# Patient Record
Sex: Female | Born: 1950 | Race: White | Hispanic: No | Marital: Married | Smoking: Former smoker
Health system: Southern US, Community
[De-identification: ages and names within clinical notes are randomized; demographics above are authoritative.]

## PROBLEM LIST (undated history)

## (undated) DIAGNOSIS — K219 Gastro-esophageal reflux disease without esophagitis: Secondary | ICD-10-CM

## (undated) DIAGNOSIS — M19012 Primary osteoarthritis, left shoulder: Secondary | ICD-10-CM

## (undated) DIAGNOSIS — E7800 Pure hypercholesterolemia, unspecified: Secondary | ICD-10-CM

## (undated) DIAGNOSIS — F329 Major depressive disorder, single episode, unspecified: Secondary | ICD-10-CM

## (undated) DIAGNOSIS — F32A Depression, unspecified: Secondary | ICD-10-CM

## (undated) DIAGNOSIS — D219 Benign neoplasm of connective and other soft tissue, unspecified: Secondary | ICD-10-CM

## (undated) DIAGNOSIS — M1711 Unilateral primary osteoarthritis, right knee: Secondary | ICD-10-CM

## (undated) DIAGNOSIS — I10 Essential (primary) hypertension: Secondary | ICD-10-CM

## (undated) HISTORY — PX: TONSILLECTOMY: SUR1361

## (undated) HISTORY — PX: TUBAL LIGATION: SHX77

## (undated) HISTORY — DX: Pure hypercholesterolemia, unspecified: E78.00

## (undated) HISTORY — PX: BREAST EXCISIONAL BIOPSY: SUR124

## (undated) HISTORY — PX: REDUCTION MAMMAPLASTY: SUR839

## (undated) HISTORY — DX: Depression, unspecified: F32.A

## (undated) HISTORY — DX: Major depressive disorder, single episode, unspecified: F32.9

## (undated) HISTORY — PX: BREAST SURGERY: SHX581

## (undated) HISTORY — DX: Essential (primary) hypertension: I10

## (undated) HISTORY — DX: Gastro-esophageal reflux disease without esophagitis: K21.9

## (undated) HISTORY — PX: EYE SURGERY: SHX253

## (undated) HISTORY — DX: Benign neoplasm of connective and other soft tissue, unspecified: D21.9

## (undated) HISTORY — PX: ABDOMINAL HYSTERECTOMY: SHX81

## (undated) HISTORY — PX: OTHER SURGICAL HISTORY: SHX169

---

## 1999-08-10 ENCOUNTER — Other Ambulatory Visit: Admission: RE | Admit: 1999-08-10 | Discharge: 1999-08-10 | Payer: Self-pay | Admitting: Obstetrics and Gynecology

## 2000-04-27 ENCOUNTER — Encounter: Payer: Self-pay | Admitting: Family Medicine

## 2000-04-27 ENCOUNTER — Encounter: Admission: RE | Admit: 2000-04-27 | Discharge: 2000-04-27 | Payer: Self-pay | Admitting: Family Medicine

## 2000-08-14 ENCOUNTER — Other Ambulatory Visit: Admission: RE | Admit: 2000-08-14 | Discharge: 2000-08-14 | Payer: Self-pay | Admitting: Obstetrics and Gynecology

## 2000-09-25 ENCOUNTER — Ambulatory Visit (HOSPITAL_COMMUNITY): Admission: RE | Admit: 2000-09-25 | Discharge: 2000-09-25 | Payer: Self-pay | Admitting: Gastroenterology

## 2000-09-25 ENCOUNTER — Encounter (INDEPENDENT_AMBULATORY_CARE_PROVIDER_SITE_OTHER): Payer: Self-pay | Admitting: *Deleted

## 2001-02-06 ENCOUNTER — Emergency Department (HOSPITAL_COMMUNITY): Admission: EM | Admit: 2001-02-06 | Discharge: 2001-02-07 | Payer: Self-pay | Admitting: Emergency Medicine

## 2001-02-07 ENCOUNTER — Encounter: Payer: Self-pay | Admitting: Emergency Medicine

## 2001-06-17 ENCOUNTER — Encounter: Payer: Self-pay | Admitting: Obstetrics and Gynecology

## 2001-06-17 ENCOUNTER — Encounter: Admission: RE | Admit: 2001-06-17 | Discharge: 2001-06-17 | Payer: Self-pay | Admitting: Obstetrics and Gynecology

## 2001-06-20 ENCOUNTER — Encounter: Payer: Self-pay | Admitting: Family Medicine

## 2001-06-20 ENCOUNTER — Encounter: Admission: RE | Admit: 2001-06-20 | Discharge: 2001-06-20 | Payer: Self-pay | Admitting: Family Medicine

## 2001-10-16 ENCOUNTER — Other Ambulatory Visit: Admission: RE | Admit: 2001-10-16 | Discharge: 2001-10-16 | Payer: Self-pay | Admitting: Obstetrics and Gynecology

## 2002-07-17 ENCOUNTER — Encounter: Payer: Self-pay | Admitting: Family Medicine

## 2002-07-17 ENCOUNTER — Encounter: Admission: RE | Admit: 2002-07-17 | Discharge: 2002-07-17 | Payer: Self-pay | Admitting: Family Medicine

## 2002-12-08 ENCOUNTER — Ambulatory Visit (HOSPITAL_COMMUNITY): Admission: RE | Admit: 2002-12-08 | Discharge: 2002-12-08 | Payer: Self-pay | Admitting: Gastroenterology

## 2002-12-08 ENCOUNTER — Encounter (INDEPENDENT_AMBULATORY_CARE_PROVIDER_SITE_OTHER): Payer: Self-pay | Admitting: *Deleted

## 2002-12-11 ENCOUNTER — Encounter: Payer: Self-pay | Admitting: Gastroenterology

## 2002-12-11 ENCOUNTER — Encounter: Admission: RE | Admit: 2002-12-11 | Discharge: 2002-12-11 | Payer: Self-pay | Admitting: Gastroenterology

## 2003-08-17 ENCOUNTER — Other Ambulatory Visit: Admission: RE | Admit: 2003-08-17 | Discharge: 2003-08-17 | Payer: Self-pay | Admitting: Obstetrics and Gynecology

## 2003-09-10 ENCOUNTER — Encounter: Admission: RE | Admit: 2003-09-10 | Discharge: 2003-09-10 | Payer: Self-pay | Admitting: Obstetrics and Gynecology

## 2004-08-26 ENCOUNTER — Other Ambulatory Visit: Admission: RE | Admit: 2004-08-26 | Discharge: 2004-08-26 | Payer: Self-pay | Admitting: Obstetrics and Gynecology

## 2004-10-17 ENCOUNTER — Encounter: Admission: RE | Admit: 2004-10-17 | Discharge: 2005-01-15 | Payer: Self-pay | Admitting: Internal Medicine

## 2005-05-08 ENCOUNTER — Encounter: Admission: RE | Admit: 2005-05-08 | Discharge: 2005-05-08 | Payer: Self-pay | Admitting: Obstetrics and Gynecology

## 2005-09-12 ENCOUNTER — Other Ambulatory Visit: Admission: RE | Admit: 2005-09-12 | Discharge: 2005-09-12 | Payer: Self-pay | Admitting: Obstetrics and Gynecology

## 2005-10-24 ENCOUNTER — Encounter: Admission: RE | Admit: 2005-10-24 | Discharge: 2005-10-24 | Payer: Self-pay | Admitting: Family Medicine

## 2006-06-15 ENCOUNTER — Encounter: Admission: RE | Admit: 2006-06-15 | Discharge: 2006-06-15 | Payer: Self-pay | Admitting: Obstetrics and Gynecology

## 2006-07-27 ENCOUNTER — Encounter: Admission: RE | Admit: 2006-07-27 | Discharge: 2006-07-27 | Payer: Self-pay | Admitting: Gastroenterology

## 2006-09-13 ENCOUNTER — Other Ambulatory Visit: Admission: RE | Admit: 2006-09-13 | Discharge: 2006-09-13 | Payer: Self-pay | Admitting: Obstetrics and Gynecology

## 2006-12-24 ENCOUNTER — Inpatient Hospital Stay (HOSPITAL_COMMUNITY): Admission: EM | Admit: 2006-12-24 | Discharge: 2006-12-28 | Payer: Self-pay | Admitting: Emergency Medicine

## 2006-12-24 ENCOUNTER — Encounter: Admission: RE | Admit: 2006-12-24 | Discharge: 2006-12-24 | Payer: Self-pay | Admitting: Family Medicine

## 2007-06-17 ENCOUNTER — Encounter: Admission: RE | Admit: 2007-06-17 | Discharge: 2007-06-17 | Payer: Self-pay | Admitting: Family Medicine

## 2007-09-23 ENCOUNTER — Other Ambulatory Visit: Admission: RE | Admit: 2007-09-23 | Discharge: 2007-09-23 | Payer: Self-pay | Admitting: Obstetrics and Gynecology

## 2008-06-17 ENCOUNTER — Encounter: Admission: RE | Admit: 2008-06-17 | Discharge: 2008-06-17 | Payer: Self-pay | Admitting: Obstetrics and Gynecology

## 2008-09-25 ENCOUNTER — Encounter: Payer: Self-pay | Admitting: Obstetrics and Gynecology

## 2008-09-25 ENCOUNTER — Ambulatory Visit: Payer: Self-pay | Admitting: Obstetrics and Gynecology

## 2008-09-25 ENCOUNTER — Other Ambulatory Visit: Admission: RE | Admit: 2008-09-25 | Discharge: 2008-09-25 | Payer: Self-pay | Admitting: Obstetrics and Gynecology

## 2009-06-18 ENCOUNTER — Encounter: Admission: RE | Admit: 2009-06-18 | Discharge: 2009-06-18 | Payer: Self-pay | Admitting: Obstetrics and Gynecology

## 2009-10-05 ENCOUNTER — Other Ambulatory Visit: Admission: RE | Admit: 2009-10-05 | Discharge: 2009-10-05 | Payer: Self-pay | Admitting: Obstetrics and Gynecology

## 2009-10-05 ENCOUNTER — Ambulatory Visit: Payer: Self-pay | Admitting: Obstetrics and Gynecology

## 2010-03-18 ENCOUNTER — Ambulatory Visit (HOSPITAL_BASED_OUTPATIENT_CLINIC_OR_DEPARTMENT_OTHER): Admission: RE | Admit: 2010-03-18 | Discharge: 2010-03-18 | Payer: Self-pay | Admitting: Orthopedic Surgery

## 2010-03-18 HISTORY — PX: CARPAL TUNNEL RELEASE: SHX101

## 2010-06-24 ENCOUNTER — Encounter: Admission: RE | Admit: 2010-06-24 | Discharge: 2010-06-24 | Payer: Self-pay | Admitting: Obstetrics and Gynecology

## 2010-08-26 ENCOUNTER — Encounter
Admission: RE | Admit: 2010-08-26 | Discharge: 2010-08-26 | Payer: Self-pay | Source: Home / Self Care | Attending: Family Medicine | Admitting: Family Medicine

## 2010-09-18 HISTORY — PX: BACK SURGERY: SHX140

## 2010-10-06 ENCOUNTER — Ambulatory Visit: Admit: 2010-10-06 | Payer: Self-pay | Admitting: Obstetrics and Gynecology

## 2010-10-09 ENCOUNTER — Encounter: Payer: Self-pay | Admitting: Obstetrics and Gynecology

## 2010-12-04 LAB — BASIC METABOLIC PANEL
BUN: 18 mg/dL (ref 6–23)
Calcium: 9.5 mg/dL (ref 8.4–10.5)
Creatinine, Ser: 0.68 mg/dL (ref 0.4–1.2)
GFR calc non Af Amer: >60 mL/min (ref >60)
Glucose, Bld: 108 mg/dL — ABNORMAL HIGH (ref 70–99)
Potassium: 5 mEq/L (ref 3.5–5.1)

## 2010-12-29 ENCOUNTER — Other Ambulatory Visit (HOSPITAL_COMMUNITY)
Admission: RE | Admit: 2010-12-29 | Discharge: 2010-12-29 | Disposition: A | Discharge status: Home / Self Care | Payer: BLUE CROSS/BLUE SHIELD | Source: Ambulatory Visit | Attending: Obstetrics and Gynecology | Admitting: Obstetrics and Gynecology

## 2010-12-29 ENCOUNTER — Other Ambulatory Visit: Payer: Self-pay | Admitting: Obstetrics and Gynecology

## 2010-12-29 ENCOUNTER — Encounter (INDEPENDENT_AMBULATORY_CARE_PROVIDER_SITE_OTHER): Payer: BLUE CROSS/BLUE SHIELD | Admitting: Obstetrics and Gynecology

## 2010-12-29 DIAGNOSIS — Z124 Encounter for screening for malignant neoplasm of cervix: Secondary | ICD-10-CM | POA: Insufficient documentation

## 2010-12-29 DIAGNOSIS — Z01419 Encounter for gynecological examination (general) (routine) without abnormal findings: Secondary | ICD-10-CM

## 2011-02-03 NOTE — Procedures (Signed)
[place]  Patient:    Madeline Clarke, Madeline Clarke                           MRN: [id number] Proc. Date: 09/25/00 Attending:  Petra Kuba, M.D. CC:         Doreatha Lew, M.D.   Procedure Report  PROCEDURE:  Colonoscopy.  INDICATION:  Patient with bright red blood per rectum, almost due for colonic screening.  Consent signed after risks, benefits, methods, and options thoroughly discussed in the office.  MEDICINES USED:  Demerol 75 mg, Versed 8 mg.  DESCRIPTION OF PROCEDURE:  Rectal inspection was pertinent for tiny external hemorrhoids.  Digital exam was negative.  Pediatric video colonoscope was inserted and with mild difficulty due to a tortuous sigmoid was able to be advanced to the cecum.  This required some abdominal pressure but no position changes.  Cecum was identified by the appendiceal orifice and the ileocecal valve.  In fact, the scope was inserted a short way into the terminal ileum, which was normal.  Photo documentation was obtained.  The scope was slowly withdrawn.  The prep was adequate.  There was some liquid stool that required washing and suctioning.  No obvious abnormalities were seen on insertion and on slow withdrawal through the colon, no abnormalities were seen as we slowly withdrew back to the rectum.  Once back in the rectum the scope was retroflexed, revealing some tiny internal hemorrhoids, possibly a small healed tear was seen.  The scope was straightened and re-advanced a short way up the sigmoid, air was suctioned, and the scope removed.  The patient tolerated the procedure well.  There was no obvious immediate complication.  ENDOSCOPIC DIAGNOSES: 1. Tiny internal-external hemorrhoid, possibly healed internal tear. 2. Otherwise within normal limits to the terminal ileum without any blood    being seen.  PLAN:  Continue workup with an endoscopy for her chronic upper tract symptoms. Recheck guaiacs and follow up in two  months.  Happy to see back sooner p.r.n. Otherwise, probably will repeat screening in five years and yearly rectals and guaiacs per Dr. Adonis Housekeeper or GYN.  See endoscopy further workup plans and findings. DD:  09/25/00 TD:  09/25/00 Job: [id number]

## 2011-02-03 NOTE — Op Note (Signed)
[place]  Patient:    Madeline Clarke, Madeline Clarke                           MRN: [id number] Proc. Date: 09/25/00 Attending:  Petra Kuba, M.D. CC:         Doreatha Lew, M.D.   Operative Report  PROCEDURE:  Esophagogastroduodenoscopy with biopsy.  ENDOSCOPIST:  Petra Kuba, M.D.  ANESTHESIA:  General anesthesia per patient request.  INDICATIONS: Longstanding upper tract symptoms, helped with Prilosec 40.  Want to rule out Barretts, confirm hiatal hernia.  Consent was signed after risks, benefits, methods, and options were thoroughly discussed before any premedications were given and in the office.  MEDICINES USED:  Demerol 25 mg, Versed 2 mg.  DESCRIPTION OF PROCEDURE:  Video endoscope was inserted by direct vision.  Her proximal and mid esophagus was normal.  In the distal esophagus was a small to medium size hiatal hernia.  I did not believe she had Barretts, but the hernia did extend up the esophagus, and multiple distal esophageal biopsies to rule out any metaplasia were obtained at the end of the procedure.  The scope was passed into the stomach where some mild gastritis and antritis were seen. Advanced into a normal pylorus and normal duodenal bulb and around the cecum to a normal second portion of the duodenum.  Scope was withdraw to the bulb, and a good look there ruled out ulcers or other abnormalities.  Scope was withdrawn back to the stomach and retroflexed.  High in the cardia, the hiatal hernia was confirmed.  The anularis, fundus, lesser and greater curve were normal other than some mild gastritis on retroflexion.  Straight visualization confirmed the gastritis and ruled out any additional findings.  The scope was slowly withdrawn back to 20 cm which confirmed the above findings.  The scope was then advanced to the distal esophagus, and biopsies of the questionable tuft of the hiatal hernia pouch versus an area of short segment  Barretts were obtained.  The scope was then slowly withdrawn.  The patient tolerated the procedure well.  There was no obvious immediate complication.  ENDOSCOPIC DIAGNOSES: 1. Small to medium hiatal hernia. 2. Doubt Barretts versus biopsies of the top of the hiatal hernia pouch. 3. Minimal gastritis and antritis. 4. Otherwise normal esophagogastroduodenoscopy.  PLAN:  Continue Prilosec.  Await biopsy to see if further Barretts screening is needed.  Will see back in two months or p.r.n. D:  09/25/00 TD:  09/25/00 Job: [id number]

## 2011-02-03 NOTE — Op Note (Signed)
NAMEBENNIE, Madeline Clarke                            ACCOUNT NO.:  [id number]   MEDICAL RECORD NO.:  [id number]                   PATIENT TYPE:  AMB   LOCATION:  ENDO                                 FACILITY:   PHYSICIAN:  Petra Kuba, M.D.                 DATE OF BIRTH:  03/24/1951   DATE OF PROCEDURE:  12/08/2002  DATE OF DISCHARGE:                                 OPERATIVE REPORT   PROCEDURE:  Esophagogastroduodenoscopy with biopsy.   INDICATIONS:  The patient with increasing upper tract symptoms, questionable  history of Barrett's.  Consent was signed after risks, benefits, methods,  options were thoroughly discussed in the past.   MEDICATIONS:  Demerol 100, Versed 10.   DESCRIPTION OF PROCEDURE:  The video endoscope was inserted by direct  vision.  The proximal and mid esophagus were normal.  In the distal  esophagus was a tiny to small hiatal hernia.  I do not think she had  Barrett's but the hiatal hernia top seemed to encroach on the esophagus and  multiple biopsies of this area were taken at the end of the procedure.  The  scope was inserted into the stomach, advanced through a normal antrum,  normal pylorus into a normal duodenal bulb and on to a normal second portion  of the duodenum.  The scope was slowly withdrawn back to the bulb.  No  duodenal findings were seen.  Scope was withdrawn back to the stomach and  evaluated on retroflexion and straight visualization.  A good look at the  cardia, fundus, annularis, lesser and greater curve.  No stomach  abnormalities were seen.  The scope was then slowly withdrawn back to about  18 cm which confirmed the normal esophagus.  The biopsies of the distal  esophagus were obtained.  Air was suctioned from the stomach.  The scope was  slowly withdrawn and again a good look at the esophagus was normal.  The  scope was removed.  The patient tolerated the procedure well.  There was no  obvious immediate complications.   ENDOSCOPIC  DIAGNOSES:  1. Small hiatal hernia, doubt Barrett's, status post biopsy.  2. Otherwise normal esophagogastroduodenoscopy.   PLAN:  Proton pump inhibitors.  Consider Reglan in the future.  Will get an  ultrasound of her abdomen to make sure gallstones are not playing a role in  her symptoms.  Follow up in roughly two months to recheck symptoms and make  sure no further workup plans are needed or trying other pump inhibitors.                                                Petra Kuba, M.D.    MEM/MEDQ  D:  12/08/2002  T:  12/08/2002  Job:  [id number]   cc:   Jethro Bastos, M.D.  [address]  [place]  [place] [address]  Fax: [phone]

## 2011-02-03 NOTE — Discharge Summary (Signed)
Madeline Clarke, Madeline Clarke                  ACCOUNT NO.:  [id number]   MEDICAL RECORD NO.:  [id number]          PATIENT TYPE:  INP   LOCATION:  1401                         FACILITY:  [facility]   PHYSICIAN:  Madeline Clarke, M.D.DATE OF BIRTH:  10/29/1950   DATE OF ADMISSION:  12/24/2006  DATE OF DISCHARGE:  12/28/2006                               DISCHARGE SUMMARY   DISCHARGE DIAGNOSES:  1. Lingular/left lower lobe pneumonia.  2. Probable chronic obstructive pulmonary disease exacerbation;      patient with past history of tobacco abuse and with bronchospasm      upon admission, likely precipitated by #1.  3. Hypertension.  4. Hyperlipidemia.  5. History of depression.  6. Hypokalemia; resolved.   HISTORY:  The patient is a pleasant 60 year old white female with above-  listed medical problems who presented with complaints of shortness of  breath.  She reported that she had had problems with intermittent cough  and dyspnea on exertion.  In the 24 hours prior to admission she stated  that her cough and shortness of breath had worsened significantly.  The  patient had seen her primary care physician upon followup and was found  to be hypoxic, O2 sat in the 70s, and improved slightly following the  breathing treatment, but her PCP was concerned about a pneumonia, and  she was sent to the emergency room where she was admitted to the [facility] service.   Physical exam upon admission as per Dr. Rito Clarke revealed a temperature  of 99.7 with a pulse of 94, blood pressure of 133/88, respiratory rate  of 20, O2 sat of 97%.  In general, the patient was alert and oriented,  in some mild respiratory distress but improved on nasal cannula oxygen.  The other pertinent findings on exam:  HEENT with dry mucous membranes,  and on her lung exam she was noted to have bilateral expiratory wheezes,  right greater than left, and decreased breath sounds at the bases.  The  rest of her physical exam  was reported to be within normal limits.   LABORATORY DATA:  Her white cell count was 10.7 with a hemoglobin of  12.3, hematocrit of 34.8, platelet count of 273.  Sodium 135 with a  potassium of 3.4, chloride 95, CO2 of 30, glucose of 123, BUN of 6,  creatinine of 0.6.  She had a chest x-ray done, which showed lingular  pneumonia and followup for clearing recommended.   HOSPITAL COURSE:  Problem #1:  Lingular pneumonia.  Upon admission the  patient was empirically started on IV antibiotics, and blood cultures  were done and this did not grow any bacteria.  She had a followup chest  x-ray while in the hospital, which showed the opacity in the left lower  lobe persisting, consistent with a residual pneumonia.  The patient's  shortness of breath improved as well as the cough.  Her energy level  also improved, and patient has been able to ambulate and tolerate p.o.  well.  She was placed on expectorant/mucolytics while in the hospital,  and the cough  has also improved significantly.  Madeline Clarke has remained  afebrile and hemodynamically stable and, as already discussed, her  symptoms have improved.  She will be discharged at this point on oral  antibiotics and is to follow up with her primary care physician, Dr.  Dorothe Clarke.   Problem #2:  Probable chronic obstructive pulmonary disease  exacerbation.  Patient was noted to have bilateral wheezing upon  admission and was started on steroids as well as nebulized  bronchodilators.  As discussed above, she was also placed on antibiotics  and mucolytics.  With this intervention, her symptoms improved.  Her O2  sats were checked on room air today and they ranged from 86-88%.  She  meets criteria for home O2 and will be discharged on home O2.  She is to  follow up with her primary care physician for this to be reassessed.  She is to continue Combivent and prednisone taper upon discharge as well  as the antibiotics as above.   Problem #3:   Hypokalemia.  The patient's potassium was replaced while in  the hospital.  Her last potassium today prior to discharge is 3.8.   Problem #4:  Hypertension.  Patient was maintained on her outpatient  antihypertensives during her hospital stay.   Problem #5:  Gastroesophageal reflux disease.  Patient was maintained on  proton pump inhibitor during her hospital stay.   Problem #6:  Depression.  Patient was maintained on her Effexor during  her hospital stay.   DISCHARGE MEDICATIONS:  1. Mucinex 600 mg two p.o. b.i.d.  2. Tussionex 5 mL p.o. q.h.s. p.r.n.  3. Avelox 400 mg p.o. daily for 10 more days.  4. Combivent two puffs q.i.d.  5. Prednisone taper as directed.  6. Patient to continue Aciphex, atenolol, Crestor, Diovan, Effexor,      estradiol, hydrochlorothiazide, and verapamil.   FOLLOWUP:  Dr. Dorothe Clarke next week.  Patient to call for appointment.   DISCHARGE CONDITION:  Improved/stable.      Madeline Clarke, M.D.  Electronically Signed     ACV/MEDQ  D:  12/28/2006  T:  12/28/2006  Job:  [id number]   cc:   Madeline Clarke, M.D.  Fax: [phone]

## 2011-02-03 NOTE — H&P (Signed)
Madeline Clarke, Madeline Clarke                  ACCOUNT NO.:  [id number]   MEDICAL RECORD NO.:  [id number]          PATIENT TYPE:  INP   LOCATION:  0102                         FACILITY:  [facility]   PHYSICIAN:  Hollice Espy, M.D.DATE OF BIRTH:  October 31, 1950   DATE OF ADMISSION:  12/24/2006  DATE OF DISCHARGE:                              HISTORY & PHYSICAL   PRIMARY CARE PHYSICIAN:  Jethro Bastos, M.D.   CHIEF COMPLAINT:  Shortness of breath.   HISTORY OF PRESENT ILLNESS:  The patient is a 60 year old, white female  with past medical history of GERD, hypertension and hyperlipidemia who  for the past 5 days has had problems with intermittent cough, dyspnea on  exertion.  Over the last 24 hours, her cough and shortness of breath has  markedly  progressed.  She initially had followed up with her PCP on the  second day of her symptoms, but now has progressed to the point when Dr.  Dorothe Pea saw the patient in followup, found her to be quite hypoxic with  an O2 saturation in the 70s.  After a breathing treatment, her  oxygenation improved little.  There was concern about the patient having  pneumonia and the patient was sent over the emergency room where she was  met by the hospitalist for further evaluation.  The patient was put on  oxygen and nebulizers and has said that has improved her symptoms quite  a bit.  She complains of a generalized headache, but denies any visual  changes, dysphagia, no neck stiffness, no chest pain, no palpitations.  She does complain of some shortness of breath better with oxygenation.  She does complain of wheezing and coughing with a productive cough with  sputum ranging anywhere from clear to yellowish, to brown and green.  She denies any hemoptysis, no abdominal pain, no hematuria, dysuria,  constipation, diarrhea, focal extremity numbness, weakness or pain.  Review of systems otherwise negative.   PAST MEDICAL HISTORY:  1. Hypertension.  2. Hyperlipidemia.  3. Tobacco use.  Quit 10 years ago.  4. GERD.  5. Depression.   MEDICATIONS:  Aciphex, albuterol, atenolol, Crestor, Diovan, Effexor,  Estradiol, hydrochlorothiazide and Verapamil.   ALLERGIES:  PENICILLIN.   SOCIAL HISTORY:  She denies any current tobacco use.  She smoked x11  years and then quit 9 years ago.  She denies any drug use.  She drinks  socially.   FAMILY HISTORY:  Noncontributory.   PHYSICAL EXAMINATION:  VITAL SIGNS:  Temperature 99.7, heart rate 94,  blood pressure 133/88, respirations 20, O2 saturations 97% on 2 L.  GENERAL:  The patient is alert and oriented x3 in some mild respiratory  distress, but better with oxygen.  HEENT:  Normocephalic, atraumatic.  Mucous membranes are dry.  She has  no carotid bruits.  HEART:  Regular rate and rhythm, S1, S2.  LUNGS:  Bilateral expiratory wheezing with right greater than left and  decreased breath sounds at bases.  ABDOMEN:  Soft, nondistended, nontender, positive bowel sounds.  EXTREMITIES:  No clubbing, cyanosis or edema.   LABORATORY DATA AND X-RAY  FINDINGS:  I have ordered a CBC and BMET which  are pending.   ASSESSMENT/PLAN:  1. Pneumonia with questionable underlying chronic obstructive      pulmonary disease.  Will plan to start the patient on intravenous      Avelox, supplement oxygen and around the clock breathing      treatments.  In addition, because of her significant wheezing, I am      adding Solu-Medrol at this time.  2. Hypertension.  Will get her medication doses and start this as      well.  3. Hyperlipidemia.  Continue Crestor.  4. Depression.  Continue Effexor.      Hollice Espy, M.D.  Electronically Signed     SKK/MEDQ  D:  12/24/2006  T:  12/24/2006  Job:  [id number]   cc:   Jethro Bastos, M.D.  Fax: [phone]

## 2011-06-01 ENCOUNTER — Other Ambulatory Visit: Payer: Self-pay | Admitting: Obstetrics and Gynecology

## 2011-06-01 DIAGNOSIS — Z1231 Encounter for screening mammogram for malignant neoplasm of breast: Secondary | ICD-10-CM

## 2011-07-03 ENCOUNTER — Ambulatory Visit
Admission: RE | Admit: 2011-07-03 | Discharge: 2011-07-03 | Disposition: A | Discharge status: Home / Self Care | Payer: BC Managed Care – PPO | Source: Ambulatory Visit | Attending: Obstetrics and Gynecology | Admitting: Obstetrics and Gynecology

## 2011-07-03 DIAGNOSIS — Z1231 Encounter for screening mammogram for malignant neoplasm of breast: Secondary | ICD-10-CM

## 2011-12-29 ENCOUNTER — Encounter: Payer: Self-pay | Admitting: Gynecology

## 2011-12-29 DIAGNOSIS — I10 Essential (primary) hypertension: Secondary | ICD-10-CM | POA: Insufficient documentation

## 2011-12-29 DIAGNOSIS — K219 Gastro-esophageal reflux disease without esophagitis: Secondary | ICD-10-CM | POA: Insufficient documentation

## 2011-12-29 DIAGNOSIS — F32A Depression, unspecified: Secondary | ICD-10-CM | POA: Insufficient documentation

## 2011-12-29 DIAGNOSIS — D219 Benign neoplasm of connective and other soft tissue, unspecified: Secondary | ICD-10-CM | POA: Insufficient documentation

## 2011-12-29 DIAGNOSIS — F329 Major depressive disorder, single episode, unspecified: Secondary | ICD-10-CM | POA: Insufficient documentation

## 2012-01-09 ENCOUNTER — Ambulatory Visit (INDEPENDENT_AMBULATORY_CARE_PROVIDER_SITE_OTHER): Payer: BC Managed Care – PPO | Admitting: Obstetrics and Gynecology

## 2012-01-09 ENCOUNTER — Encounter: Payer: Self-pay | Admitting: Obstetrics and Gynecology

## 2012-01-09 VITALS — BP 144/86 | Ht 61.0 in | Wt 172.0 lb

## 2012-01-09 DIAGNOSIS — E7800 Pure hypercholesterolemia, unspecified: Secondary | ICD-10-CM | POA: Insufficient documentation

## 2012-01-09 DIAGNOSIS — Z01419 Encounter for gynecological examination (general) (routine) without abnormal findings: Secondary | ICD-10-CM

## 2012-01-09 NOTE — Progress Notes (Signed)
Patient came to see me today for her annual GYN exam. She is doing well without HRT. She is up-to-date on mammograms. Her last bone density was 2007 and was normal. She has had no fractures. She is having no vaginal bleeding. She is having no pelvic pain. She's not sexually active. She is not having dysuria, frequency, urgency or incontinence.  HEENT: Within normal limits. Kennon Portela present. Neck: No masses. Supraclavicular lymph nodes: Not enlarged. Breasts: Examined in both sitting and lying position. Symmetrical without skin changes or masses. Abdomen: Soft no masses guarding or rebound. No hernias. Pelvic: External within normal limits. BUS within normal limits. Vaginal examination shows good estrogen effect, no cystocele enterocele or rectocele. Cervix and uterus absent. Adnexa within normal limits. Rectovaginal confirmatory. Extremities within normal limits.  Assessment: Normal GYN exam  Plan: Continue yearly mammograms. Bone density. Lab work through PCP.

## 2012-06-03 ENCOUNTER — Telehealth: Payer: Self-pay | Admitting: *Deleted

## 2012-06-03 ENCOUNTER — Other Ambulatory Visit: Payer: Self-pay | Admitting: Obstetrics and Gynecology

## 2012-06-03 DIAGNOSIS — Z1231 Encounter for screening mammogram for malignant neoplasm of breast: Secondary | ICD-10-CM

## 2012-06-03 DIAGNOSIS — Z8739 Personal history of other diseases of the musculoskeletal system and connective tissue: Secondary | ICD-10-CM

## 2012-06-03 DIAGNOSIS — Z9889 Other specified postprocedural states: Secondary | ICD-10-CM

## 2012-06-03 NOTE — Telephone Encounter (Signed)
Pt called requesting order for bone density placed at breast center, order placed.

## 2012-06-11 ENCOUNTER — Encounter (HOSPITAL_BASED_OUTPATIENT_CLINIC_OR_DEPARTMENT_OTHER): Payer: Self-pay | Admitting: *Deleted

## 2012-06-11 ENCOUNTER — Other Ambulatory Visit: Payer: Self-pay | Admitting: Orthopedic Surgery

## 2012-06-11 NOTE — Progress Notes (Signed)
To come in fro bmet-ekg-was here 2011 for rt ctr

## 2012-06-12 ENCOUNTER — Encounter (HOSPITAL_BASED_OUTPATIENT_CLINIC_OR_DEPARTMENT_OTHER)
Admission: RE | Admit: 2012-06-12 | Discharge: 2012-06-12 | Disposition: A | Discharge status: Home / Self Care | Payer: BC Managed Care – PPO | Source: Ambulatory Visit | Attending: Orthopedic Surgery | Admitting: Orthopedic Surgery

## 2012-06-12 LAB — BASIC METABOLIC PANEL
BUN: 22 mg/dL (ref 6–23)
Chloride: 100 mEq/L (ref 96–112)
Creatinine, Ser: 0.64 mg/dL (ref 0.50–1.10)
Glucose, Bld: 115 mg/dL — ABNORMAL HIGH (ref 70–99)
Potassium: 4.7 mEq/L (ref 3.5–5.1)

## 2012-06-13 NOTE — H&P (Signed)
Madeline Clarke is an 61 y.o. female.   Chief Complaint: c/o chronic and progressive numbness and tingling of the left hand HPI: Madeline Clarke returned to discuss a number of issues affecting her hands. 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 She has had several injections into the left ulnar bursa to provide temporary relief of her symptoms and she now wishes to proceed with surgical intervention to the left hand.  Madeline Clarke also has bilateral thumb CMC arthrosis with shouldering of her thumbs and a palpable loose body on the left. 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 She also has swelling of her left index finger PIP joint due to osteoarthrosis. She has both daytime and nighttime numbness. She has been wearing wrist forearm splints with only partial relief. She does have a strong family history of osteoarthritis affecting her mother. Her mother may also have experienced rheumatoid arthritis.  Past Medical History  Diagnosis Date  . Fibroid   . Hypertension   . GERD (gastroesophageal reflux disease)   . Depression   . Elevated cholesterol     Past Surgical History  Procedure Date  . Carpal tunnel release 7/11    rt ctr  . Tubal ligation   . Abdominal hysterectomy     TAH  . Microsurgical tubal reanastomosis   . Left salpingectomy   . Tonsillectomy   . Back surgery 2012  . Eye surgery     [id number] 08-31-14    Family History  Problem Relation Age of Onset  . Hypertension Mother   . Hypertension Sister   . Hypertension Brother    Social History:  reports that she quit smoking about 15 years ago. She does not have any smokeless tobacco  history on file. She reports that she drinks about 10 ounces of alcohol per week. She reports that she does not use illicit drugs.  Allergies:  Allergies  Allergen Reactions  . Bee Venom Swelling    Throat swelling  . Amlodipine Swelling  . Penicillins   . Shellfish Allergy Hives    No prescriptions prior to admission    Results for orders placed during the hospital encounter of 06/14/12 (from the past 48 hour(s))  BASIC METABOLIC PANEL     Status: Abnormal   Collection Time   06/12/12 12:20 PM      Component Value Range Comment   Sodium 139  135 - 145 mEq/L    Potassium 4.7  3.5 - 5.1 mEq/L    Chloride 100  96 - 112 mEq/L    CO2 25  19 - 32 mEq/L    Glucose, Bld 115 (*) 70 - 99 mg/dL    BUN 22  6 - 23 mg/dL    Creatinine, Ser 1.61  0.50 - 1.10 mg/dL    Calcium 9.9  8.4 - 09.6 mg/dL    GFR calc non Af Amer >90  >90 mL/min    GFR calc Af Amer >90  >90 mL/min     No results found.   Pertinent items are noted in HPI.  Height 5\' 1"  (1.549 m), weight 78.019 kg (172 lb).  General appearance: alert Head: Normocephalic, without obvious abnormality Neck: supple, symmetrical, trachea  midline Resp: clear to auscultation bilaterally Cardio: regular rate and rhythm GI: normal findings: bowel sounds normal Extremities She has full AROM of her fingers in flexion/extension. There is no sign of stenosing tenosynovitis. She has discomfort with translation and grind of her CMC joints. She has hyperextension of her right thumb MP joint compensating for stiffness of her right thumb CMC joint. She has thickening of the capsule of her left index finger PIP joint consistent with osteoarthrosis. She has a positve wrist flexion test and Tinel's. 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 Injection of thumb CMC joints with steroid.  DASNOIT,Loys Shugars J 06/13/2012, 9:30 PM  H&P documentation: 06/14/2012  -History and Physical Reviewed  -Patient has been re-examined  -No change in the plan of care  Wyn Forster, MD

## 2012-06-14 ENCOUNTER — Encounter (HOSPITAL_BASED_OUTPATIENT_CLINIC_OR_DEPARTMENT_OTHER): Payer: Self-pay | Admitting: Anesthesiology

## 2012-06-14 ENCOUNTER — Encounter (HOSPITAL_BASED_OUTPATIENT_CLINIC_OR_DEPARTMENT_OTHER): Payer: Self-pay | Admitting: *Deleted

## 2012-06-14 ENCOUNTER — Encounter (HOSPITAL_BASED_OUTPATIENT_CLINIC_OR_DEPARTMENT_OTHER)
Admission: RE | Disposition: A | Discharge status: Home / Self Care | Payer: Self-pay | Source: Ambulatory Visit | Attending: Orthopedic Surgery

## 2012-06-14 ENCOUNTER — Ambulatory Visit (HOSPITAL_BASED_OUTPATIENT_CLINIC_OR_DEPARTMENT_OTHER): Payer: BC Managed Care – PPO | Admitting: Anesthesiology

## 2012-06-14 ENCOUNTER — Encounter (HOSPITAL_BASED_OUTPATIENT_CLINIC_OR_DEPARTMENT_OTHER): Payer: Self-pay | Admitting: Certified Registered Nurse Anesthetist

## 2012-06-14 ENCOUNTER — Ambulatory Visit (HOSPITAL_BASED_OUTPATIENT_CLINIC_OR_DEPARTMENT_OTHER)
Admission: RE | Admit: 2012-06-14 | Discharge: 2012-06-14 | Disposition: A | Discharge status: Home / Self Care | Payer: BC Managed Care – PPO | Source: Ambulatory Visit | Attending: Orthopedic Surgery | Admitting: Orthopedic Surgery

## 2012-06-14 DIAGNOSIS — G5600 Carpal tunnel syndrome, unspecified upper limb: Secondary | ICD-10-CM | POA: Insufficient documentation

## 2012-06-14 DIAGNOSIS — Z01812 Encounter for preprocedural laboratory examination: Secondary | ICD-10-CM | POA: Insufficient documentation

## 2012-06-14 DIAGNOSIS — K219 Gastro-esophageal reflux disease without esophagitis: Secondary | ICD-10-CM | POA: Insufficient documentation

## 2012-06-14 DIAGNOSIS — M19049 Primary osteoarthritis, unspecified hand: Secondary | ICD-10-CM | POA: Insufficient documentation

## 2012-06-14 DIAGNOSIS — I10 Essential (primary) hypertension: Secondary | ICD-10-CM | POA: Insufficient documentation

## 2012-06-14 DIAGNOSIS — Z01810 Encounter for preprocedural cardiovascular examination: Secondary | ICD-10-CM | POA: Insufficient documentation

## 2012-06-14 HISTORY — PX: STERIOD INJECTION: SHX5046

## 2012-06-14 HISTORY — PX: CARPAL TUNNEL RELEASE: SHX101

## 2012-06-14 LAB — POCT HEMOGLOBIN-HEMACUE: Hemoglobin: 10.7 g/dL — ABNORMAL LOW (ref 12.0–15.0)

## 2012-06-14 SURGERY — CARPAL TUNNEL RELEASE
Anesthesia: General | Site: Wrist | Laterality: Left | Wound class: Clean

## 2012-06-14 MED ORDER — LACTATED RINGERS IV SOLN
INTRAVENOUS | Status: DC
  Administered 2012-06-14 (×2): via INTRAVENOUS

## 2012-06-14 MED ORDER — MIDAZOLAM HCL 5 MG/5ML IJ SOLN
INTRAMUSCULAR | Status: DC | PRN
  Administered 2012-06-14: 1 mg via INTRAVENOUS

## 2012-06-14 MED ORDER — CHLORHEXIDINE GLUCONATE 4 % EX LIQD: 60.0000 mL | Freq: Once | CUTANEOUS | Status: DC

## 2012-06-14 MED ORDER — PROPOFOL 10 MG/ML IV BOLUS
INTRAVENOUS | Status: DC | PRN
  Administered 2012-06-14: 200 mg via INTRAVENOUS

## 2012-06-14 MED ORDER — LIDOCAINE HCL (CARDIAC) 20 MG/ML IV SOLN
INTRAVENOUS | Status: DC | PRN
  Administered 2012-06-14: 60 mg via INTRAVENOUS

## 2012-06-14 MED ORDER — OXYCODONE HCL 5 MG/5ML PO SOLN: 5.0000 mg | Freq: Once | ORAL | Status: DC | PRN

## 2012-06-14 MED ORDER — HYDROMORPHONE HCL PF 1 MG/ML IJ SOLN: 0.2500 mg | INTRAMUSCULAR | Status: DC | PRN

## 2012-06-14 MED ORDER — OXYCODONE HCL 5 MG PO TABS: 5.0000 mg | ORAL_TABLET | Freq: Once | ORAL | Status: DC | PRN

## 2012-06-14 MED ORDER — DROPERIDOL 2.5 MG/ML IJ SOLN: 0.6250 mg | INTRAMUSCULAR | Status: DC | PRN

## 2012-06-14 MED ORDER — LIDOCAINE HCL 2 % IJ SOLN
INTRAMUSCULAR | Status: DC | PRN
  Administered 2012-06-14: 1 mL

## 2012-06-14 MED ORDER — DEXAMETHASONE SODIUM PHOSPHATE 10 MG/ML IJ SOLN
INTRAMUSCULAR | Status: DC | PRN
  Administered 2012-06-14: 10 mg via INTRAVENOUS

## 2012-06-14 MED ORDER — ONDANSETRON HCL 4 MG/2ML IJ SOLN
INTRAMUSCULAR | Status: DC | PRN
  Administered 2012-06-14: 4 mg via INTRAVENOUS

## 2012-06-14 MED ORDER — HYDROCODONE-ACETAMINOPHEN 5-325 MG PO TABS: ORAL_TABLET | ORAL | Status: DC

## 2012-06-14 MED ORDER — METHYLPREDNISOLONE ACETATE 40 MG/ML IJ SUSP
INTRAMUSCULAR | Status: DC | PRN
  Administered 2012-06-14: 20 mg via INTRA_ARTICULAR

## 2012-06-14 MED ORDER — FENTANYL CITRATE 0.05 MG/ML IJ SOLN
INTRAMUSCULAR | Status: DC | PRN
  Administered 2012-06-14: 25 ug via INTRAVENOUS
  Administered 2012-06-14: 50 ug via INTRAVENOUS

## 2012-06-14 SURGICAL SUPPLY — 42 items
BANDAGE ADHESIVE 1X3 (GAUZE/BANDAGES/DRESSINGS) ×6 IMPLANT
BANDAGE ELASTIC 3 VELCRO ST LF (GAUZE/BANDAGES/DRESSINGS) ×3 IMPLANT
BLADE SURG 15 STRL LF DISP TIS (BLADE) ×2 IMPLANT
BLADE SURG 15 STRL SS (BLADE) ×3
BNDG CMPR 9X4 STRL LF SNTH (GAUZE/BANDAGES/DRESSINGS) ×2
BNDG ESMARK 4X9 LF (GAUZE/BANDAGES/DRESSINGS) ×1 IMPLANT
BRUSH SCRUB EZ PLAIN DRY (MISCELLANEOUS) ×2 IMPLANT
CLOTH BEACON ORANGE TIMEOUT ST (SAFETY) ×3 IMPLANT
CORDS BIPOLAR (ELECTRODE) ×1 IMPLANT
COVER MAYO STAND STRL (DRAPES) ×3 IMPLANT
COVER TABLE BACK 60X90 (DRAPES) ×3 IMPLANT
CUFF TOURNIQUET SINGLE 18IN (TOURNIQUET CUFF) IMPLANT
DECANTER SPIKE VIAL GLASS SM (MISCELLANEOUS) ×1 IMPLANT
DRAPE EXTREMITY T 121X128X90 (DRAPE) ×3 IMPLANT
DRAPE SURG 17X23 STRL (DRAPES) ×3 IMPLANT
GLOVE BIO SURGEON STRL SZ 6.5 (GLOVE) ×1 IMPLANT
GLOVE BIOGEL M STRL SZ7.5 (GLOVE) ×3 IMPLANT
GLOVE BIOGEL PI IND STRL 7.0 (GLOVE) IMPLANT
GLOVE BIOGEL PI INDICATOR 7.0 (GLOVE) ×1
GLOVE ORTHO TXT STRL SZ7.5 (GLOVE) ×3 IMPLANT
GOWN PREVENTION PLUS XLARGE (GOWN DISPOSABLE) ×3 IMPLANT
GOWN PREVENTION PLUS XXLARGE (GOWN DISPOSABLE) ×6 IMPLANT
NEEDLE 27GAX1X1/2 (NEEDLE) ×2 IMPLANT
PACK BASIN DAY SURGERY FS (CUSTOM PROCEDURE TRAY) ×3 IMPLANT
PAD ALCOHOL SWAB (MISCELLANEOUS) ×3 IMPLANT
PAD CAST 3X4 CTTN HI CHSV (CAST SUPPLIES) ×2 IMPLANT
PADDING CAST ABS 4INX4YD NS (CAST SUPPLIES) ×1
PADDING CAST ABS COTTON 4X4 ST (CAST SUPPLIES) ×2 IMPLANT
PADDING CAST COTTON 3X4 STRL (CAST SUPPLIES) ×3
SPLINT PLASTER CAST XFAST 3X15 (CAST SUPPLIES) ×10 IMPLANT
SPLINT PLASTER XTRA FASTSET 3X (CAST SUPPLIES) ×5
SPONGE GAUZE 4X4 12PLY (GAUZE/BANDAGES/DRESSINGS) ×3 IMPLANT
STOCKINETTE 4X48 STRL (DRAPES) ×3 IMPLANT
STRIP CLOSURE SKIN 1/2X4 (GAUZE/BANDAGES/DRESSINGS) ×3 IMPLANT
SUT PROLENE 3 0 PS 2 (SUTURE) ×3 IMPLANT
SWABSTICK POVIDONE IODINE SNGL (MISCELLANEOUS) ×6 IMPLANT
SYR 3ML 23GX1 SAFETY (SYRINGE) IMPLANT
SYR 3ML LL SCALE MARK (SYRINGE) ×2 IMPLANT
SYR CONTROL 10ML LL (SYRINGE) ×2 IMPLANT
TRAY DSU PREP LF (CUSTOM PROCEDURE TRAY) ×3 IMPLANT
UNDERPAD 30X30 INCONTINENT (UNDERPADS AND DIAPERS) ×3 IMPLANT
WATER STERILE IRR 1000ML POUR (IV SOLUTION) ×2 IMPLANT

## 2012-06-14 NOTE — Anesthesia Preprocedure Evaluation (Signed)
Anesthesia Evaluation  Patient identified by MRN, date of birth, ID band Patient awake    Reviewed: Allergy & Precautions, H&P , NPO status , Patient's Chart, lab work & pertinent test results  History of Anesthesia Complications Negative for: history of anesthetic complications  Airway Mallampati: I TM Distance: >3 FB Neck ROM: Full    Dental  (+) Teeth Intact and Dental Advisory Given   Pulmonary neg pulmonary ROS,  breath sounds clear to auscultation  Pulmonary exam normal       Cardiovascular hypertension, Pt. on medications Rhythm:Regular Rate:Normal     Neuro/Psych PSYCHIATRIC DISORDERS Depression negative neurological ROS     GI/Hepatic Neg liver ROS, GERD-  Medicated,  Endo/Other  negative endocrine ROS  Renal/GU negative Renal ROS     Musculoskeletal   Abdominal   Peds  Hematology   Anesthesia Other Findings   Reproductive/Obstetrics                           Anesthesia Physical Anesthesia Plan  ASA: II  Anesthesia Plan: General   Post-op Pain Management:    Induction: Intravenous  Airway Management Planned: LMA  Additional Equipment:   Intra-op Plan:   Post-operative Plan: Extubation in OR  Informed Consent: I have reviewed the patients History and Physical, chart, labs and discussed the procedure including the risks, benefits and alternatives for the proposed anesthesia with the patient or authorized representative who has indicated his/her understanding and acceptance.   Dental advisory given  Plan Discussed with: CRNA, Anesthesiologist and Surgeon  Anesthesia Plan Comments:         Anesthesia Quick Evaluation

## 2012-06-14 NOTE — Op Note (Signed)
[id number] 

## 2012-06-14 NOTE — Transfer of Care (Signed)
Immediate Anesthesia Transfer of Care Note  Patient: Madeline Clarke  Procedure(s) Performed: Procedure(s) (LRB) with comments: CARPAL TUNNEL RELEASE (Left) - left carpal tunnel release STEROID INJECTION (Left) - Inject bilateral CMC joints  Patient Location: PACU  Anesthesia Type: General  Level of Consciousness: awake, alert , oriented and patient cooperative  Airway & Oxygen Therapy: Patient Spontanous Breathing and Patient connected to face mask oxygen  Post-op Assessment: Report given to PACU RN and Post -op Vital signs reviewed and stable  Post vital signs: Reviewed and stable  Complications: No apparent anesthesia complications

## 2012-06-14 NOTE — Anesthesia Procedure Notes (Signed)
Procedure Name: LMA Insertion Date/Time: 06/14/2012 7:44 AM Performed by: Geraldyn Shain D Pre-anesthesia Checklist: Patient identified, Emergency Drugs available, Suction available and Patient being monitored Patient Re-evaluated:Patient Re-evaluated prior to inductionOxygen Delivery Method: Circle System Utilized Preoxygenation: Pre-oxygenation with 100% oxygen Intubation Type: IV induction Ventilation: Mask ventilation without difficulty LMA: LMA inserted LMA Size: 4.0 Number of attempts: 1 Airway Equipment and Method: bite block Placement Confirmation: positive ETCO2 Tube secured with: Tape Dental Injury: Teeth and Oropharynx as per pre-operative assessment

## 2012-06-14 NOTE — Brief Op Note (Signed)
06/14/2012  8:04 AM  PATIENT:  Madeline Clarke  61 y.o. 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   POST-OPERATIVE DIAGNOSIS:  left CTS and bilateral thumb CMC arthritis  PROCEDURE:  Procedure(s) (LRB) with comments: CARPAL TUNNEL RELEASE (Left) - left carpal tunnel release STEROID INJECTION (Left) - Inject bilateral CMC joints  SURGEON:  Surgeon(s) and Role:    * Wyn Forster., MD - Primary  PHYSICIAN ASSISTANT:   ASSISTANTS: Mallory Shirk.A-C    ANESTHESIA:   general  EBL:     BLOOD ADMINISTERED:none  DRAINS: none   LOCAL MEDICATIONS USED:  LIDOCAINE   SPECIMEN:  No Specimen  DISPOSITION OF SPECIMEN:  N/A  COUNTS:  YES  TOURNIQUET:  * Missing tourniquet times found for documented tourniquets in log:  [id number] *  DICTATION: .Other Dictation: Dictation Number [id number]  PLAN OF CARE: Discharge to home after PACU  PATIENT DISPOSITION:  PACU - hemodynamically stable.

## 2012-06-14 NOTE — Anesthesia Postprocedure Evaluation (Signed)
Anesthesia Post Note  Patient: Madeline Clarke  Procedure(s) Performed: Procedure(s) (LRB): CARPAL TUNNEL RELEASE (Left) STEROID INJECTION (Left)  Anesthesia type: general  Patient location: PACU  Post pain: Pain level controlled  Post assessment: Patient's Cardiovascular Status Stable  Last Vitals:  Filed Vitals:   06/14/12 0930  BP: 156/74  Pulse: 66  Temp: 36.4 C  Resp: 20    Post vital signs: Reviewed and stable  Level of consciousness: sedated  Complications: No apparent anesthesia complications

## 2012-06-17 ENCOUNTER — Encounter (HOSPITAL_BASED_OUTPATIENT_CLINIC_OR_DEPARTMENT_OTHER): Payer: Self-pay | Admitting: Orthopedic Surgery

## 2012-06-17 NOTE — Op Note (Signed)
NAMEARIELLAH, FAUST                  ACCOUNT NO.:  [id number]  MEDICAL RECORD NO.:  [id number]  LOCATION:                                 FACILITY:  PHYSICIAN:  Katy Fitch. Blaine Guiffre, M.D. DATE OF BIRTH:  [birth year]  DATE OF PROCEDURE:  06/14/2012 DATE OF DISCHARGE:                              OPERATIVE REPORT   PREOPERATIVE DIAGNOSES:  Chronic left carpal tunnel syndrome, also bilateral thumb carpometacarpal arthritis, Eaton stage III.  POSTOPERATIVE DIAGNOSES:  Chronic left carpal tunnel syndrome, also bilateral thumb carpometacarpal arthritis, Eaton stage III.  OPERATION: 1. Release of left transverse carpal ligament. 2. Injection of left thumb carpometacarpal joint with Depo-Medrol and     lidocaine. 3. Injection of right thumb carpometacarpal joint with Depo-Medrol and     lidocaine.  OPERATING SURGEON:  Katy Fitch. Lorelle Macaluso, MD  ASSISTANT:  Marveen Reeks Dasnoit, PA-C  ANESTHESIA:  General by LMA.  SUPERVISING ANESTHESIOLOGIST:  Zenon Mayo, MD.  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  We have followed her for carpal tunnel syndrome for quite a while and have noted progressive osteoarthritis of her thumb carpometacarpal joints.  Prior x-rays revealed significant narrowing of the CMC joints.  Recent x- rays this past week demonstrated bone-on-bone arthropathy with large palmar and radial osteophytes on the trapezium.  We had a detailed informed consent with Ms. Charnley who requested that we proceed with release of her left transverse carpal ligament at this time.  She is noted to have very significant bilateral thumb carpometacarpal arthritis.  We had informed consent regarding the value of steroid injection versus other interventions including splinting and surgery.  At this time, she would prefer to proceed with injection therapy for her arthritis.  After informed consent, she is brought to the operating  room at this time.  PROCEDURE:  Lianny Molter was interviewed in the holding area and her proper surgical sites identified with a proper marking protocol.  Her medical record was reviewed on Epic, she was noted to have multiple allergies including BEE VENOM, AMLODIPINE, PENICILLINS, and SHELLFISH.  She was interviewed by our Anesthesia team and general anesthesia by LMA technique was recommended and accepted.  She was then transferred to room #1 of the [facility] where under Dr. Robina Ade direct supervision, general anesthesia by LMA technique was induced.  The left hand and arm were prepped with ChloraPrep due to her shellfish allergy and concerned about Betadine exposure.  Her thumb carpometacarpal joints were thoroughly prepped with alcohol prior to the ChloraPrep of the left hand and a mixture of Depo-Medrol 40 mg/mL and 2% plain lidocaine a 50:50 mixture was injected with a 27- gauge needle with traction on the thumb.  We placed 1.2 mL of the mixture into the each thumb carpometacarpal joint with good joint distention.  After completion of the prep of the left hand and arm, sterile stockinette and impervious arthroscopy drapes were applied followed by exsanguination of the left arm with Esmarch bandage, inflation of the arterial tourniquet to 240 mmHg on proximal brachium.  A routine surgical time-out was accomplished.  Procedure commenced with a short  incision in the line of the ring finger in the palm.  We noted that Ms. Hoiland had rather dry and very fibrotic collagen.  This is a finding we often see in individuals who developing carpal tunnel syndrome.  Due to the lack of compliance of her soft tissues, we actually extended the incision nearly 2.5 cm whereas typically we will operate through a 15-mm incision for carpal tunnel release.  Subcutaneous tissues were carefully divided taking care to identify the palmar fascia and the distal portion of the palmaris  longus.  The fascia was released followed by identification of the common sensory branch of the median nerve.  These were followed back to the median nerve proper, which was gently isolated from the transverse carpal ligament with a Insurance risk surveyor.  The transverse carpal ligament was then released with scissors extending into the distal forearm.  This widely opened the carpal canal.  The ulnar bursa was noted to be very dry and fibrotic.  There was clear compression at the distal margin of the transverse carpal ligament.  Bleeding points were electrocauterized with bipolar current followed by repair of the skin with intradermal 3-0 Prolene suture.  Ms. Endres was placed in compressive dressing with a volar plaster splint maintaining the wrist in 15 degrees of dorsiflexion.  For aftercare, she is provided a prescription for pain in the form of Vicodin 5 mg 1 p.o. q.4-6 hours p.r.n. pain, 20 tablets without refill. We will see her back for followup in 1 week in the office for dressing change, suture removal, and advancement to a postoperative exercise program.  She understands that we have individuals on call 24/7 for our practice if she has any problems, she will contact us.     Katy Fitch Deatrice Spanbauer, M.D.     RVS/MEDQ  D:  06/14/2012  T:  06/14/2012  Job:  [id number]

## 2012-07-05 ENCOUNTER — Ambulatory Visit: Payer: BC Managed Care – PPO

## 2012-07-05 ENCOUNTER — Other Ambulatory Visit: Payer: BC Managed Care – PPO

## 2012-07-15 ENCOUNTER — Ambulatory Visit
Admission: RE | Admit: 2012-07-15 | Discharge: 2012-07-15 | Disposition: A | Discharge status: Home / Self Care | Payer: BC Managed Care – PPO | Source: Ambulatory Visit | Attending: Obstetrics and Gynecology | Admitting: Obstetrics and Gynecology

## 2012-07-15 DIAGNOSIS — Z1231 Encounter for screening mammogram for malignant neoplasm of breast: Secondary | ICD-10-CM

## 2012-07-15 DIAGNOSIS — Z780 Asymptomatic menopausal state: Secondary | ICD-10-CM

## 2012-07-15 DIAGNOSIS — Z8739 Personal history of other diseases of the musculoskeletal system and connective tissue: Secondary | ICD-10-CM

## 2012-07-15 DIAGNOSIS — Z9889 Other specified postprocedural states: Secondary | ICD-10-CM

## 2013-01-09 ENCOUNTER — Encounter: Payer: BC Managed Care – PPO | Admitting: Women's Health

## 2013-06-09 ENCOUNTER — Other Ambulatory Visit: Payer: Self-pay

## 2013-06-09 DIAGNOSIS — Z9889 Other specified postprocedural states: Secondary | ICD-10-CM

## 2013-06-09 DIAGNOSIS — Z1231 Encounter for screening mammogram for malignant neoplasm of breast: Secondary | ICD-10-CM

## 2013-07-16 ENCOUNTER — Ambulatory Visit
Admission: RE | Admit: 2013-07-16 | Discharge: 2013-07-16 | Disposition: A | Discharge status: Home / Self Care | Payer: BC Managed Care – PPO | Source: Ambulatory Visit

## 2013-07-16 DIAGNOSIS — Z9889 Other specified postprocedural states: Secondary | ICD-10-CM

## 2013-07-16 DIAGNOSIS — Z1231 Encounter for screening mammogram for malignant neoplasm of breast: Secondary | ICD-10-CM

## 2013-09-08 ENCOUNTER — Other Ambulatory Visit: Payer: Self-pay | Admitting: Orthopedic Surgery

## 2013-09-08 ENCOUNTER — Ambulatory Visit
Admission: RE | Admit: 2013-09-08 | Discharge: 2013-09-08 | Disposition: A | Discharge status: Home / Self Care | Payer: BC Managed Care – PPO | Source: Ambulatory Visit | Attending: Orthopedic Surgery | Admitting: Orthopedic Surgery

## 2013-09-08 DIAGNOSIS — M25512 Pain in left shoulder: Secondary | ICD-10-CM

## 2013-09-19 ENCOUNTER — Other Ambulatory Visit: Payer: Self-pay | Admitting: Orthopedic Surgery

## 2013-09-19 ENCOUNTER — Encounter (HOSPITAL_COMMUNITY): Payer: Self-pay | Admitting: Pharmacy Technician

## 2013-09-25 ENCOUNTER — Other Ambulatory Visit (HOSPITAL_COMMUNITY): Payer: Self-pay | Admitting: *Deleted

## 2013-09-25 ENCOUNTER — Encounter (HOSPITAL_COMMUNITY)
Admission: RE | Admit: 2013-09-25 | Discharge: 2013-09-25 | Disposition: A | Discharge status: Home / Self Care | Payer: BC Managed Care – PPO | Source: Ambulatory Visit | Attending: Orthopedic Surgery | Admitting: Orthopedic Surgery

## 2013-09-25 ENCOUNTER — Encounter (HOSPITAL_COMMUNITY): Payer: Self-pay

## 2013-09-25 DIAGNOSIS — Z01810 Encounter for preprocedural cardiovascular examination: Secondary | ICD-10-CM | POA: Insufficient documentation

## 2013-09-25 DIAGNOSIS — Z01818 Encounter for other preprocedural examination: Secondary | ICD-10-CM | POA: Insufficient documentation

## 2013-09-25 DIAGNOSIS — Z01812 Encounter for preprocedural laboratory examination: Secondary | ICD-10-CM | POA: Insufficient documentation

## 2013-09-25 LAB — TYPE AND SCREEN
ABO/RH(D): A POS
Antibody Screen: NEGATIVE

## 2013-09-25 LAB — BASIC METABOLIC PANEL
BUN: 17 mg/dL (ref 6–23)
CO2: 29 meq/L (ref 19–32)
Calcium: 9.8 mg/dL (ref 8.4–10.5)
Chloride: 99 mEq/L (ref 96–112)
Creatinine, Ser: 0.62 mg/dL (ref 0.50–1.10)
GFR calc Af Amer: >90 mL/min (ref >90)
Glucose, Bld: 118 mg/dL — ABNORMAL HIGH (ref 70–99)
Potassium: 4.3 mEq/L (ref 3.7–5.3)
SODIUM: 140 meq/L (ref 137–147)

## 2013-09-25 LAB — CBC
HCT: 36.4 % (ref 36.0–46.0)
Hemoglobin: 12 g/dL (ref 12.0–15.0)
MCH: 31.5 pg (ref 26.0–34.0)
MCHC: 33 g/dL (ref 30.0–36.0)
MCV: 95.5 fL (ref 78.0–100.0)
PLATELETS: 265 10*3/uL (ref 150–400)
RBC: 3.81 MIL/uL — AB (ref 3.87–5.11)
RDW: 13.3 % (ref 11.5–15.5)
WBC: 9 10*3/uL (ref 4.0–10.5)

## 2013-09-25 LAB — ABO/RH: ABO/RH(D): A POS

## 2013-09-25 NOTE — Pre-Procedure Instructions (Signed)
Madeline Clarke  09/25/2013   Your procedure is scheduled on:  Tuesday, September 30, 2013 at 10:55 AM.   Report to [facility] Entrance "A" Admitting Office at 8:55 AM.   Call this number if you have problems the morning of surgery: [phone]   Remember:   Do not eat food or drink liquids after midnight Monday, 09/29/13.   Take these medicines the morning of surgery with A SIP OF WATER: carvedilol (COREG), omeprazole (PRILOSEC),  venlafaxine XR (EFFEXOR-XR), albuterol (PROVENTIL HFA;VENTOLIN HFA) inhaler - if needed (Please bring inhaler with you day of surgery).  Stop all Vitamins, Herbal Medications, Fish Oil, Aspirin and NSAIDS (Meloxicam, Ibuprofen, Aleve,etc) as of today, 09/25/13.     Do not wear jewelry, make-up or nail polish.  Do not wear lotions, powders, or perfumes. You may wear deodorant.  Do not shave 48 hours prior to surgery.   Do not bring valuables to the hospital.  [facility] is not responsible                  for any belongings or valuables.               Contacts, dentures or bridgework may not be worn into surgery.  Leave suitcase in the car. After surgery it may be brought to your room.  For patients admitted to the hospital, discharge time is determined by your                treatment team.                 Special Instructions: Shower using CHG 2 nights before surgery and the night before surgery.  If you shower the day of surgery use CHG.  Use special wash - you have one bottle of CHG for all showers.  You should use approximately 1/3 of the bottle for each shower.   Please read over the following fact sheets that you were given: Pain Booklet, Coughing and Deep Breathing, Blood Transfusion Information and Surgical Site Infection Prevention

## 2013-09-29 MED ORDER — CLINDAMYCIN PHOSPHATE 900 MG/50ML IV SOLN
900.0000 mg | INTRAVENOUS | Status: AC
  Administered 2013-09-30: 900 mg via INTRAVENOUS
  Filled 2013-09-29 (×2): qty 50

## 2013-09-30 ENCOUNTER — Inpatient Hospital Stay (HOSPITAL_COMMUNITY): Payer: BC Managed Care – PPO

## 2013-09-30 ENCOUNTER — Encounter (HOSPITAL_COMMUNITY): Payer: Self-pay | Admitting: Anesthesiology

## 2013-09-30 ENCOUNTER — Inpatient Hospital Stay (HOSPITAL_COMMUNITY)
Admission: RE | Admit: 2013-09-30 | Discharge: 2013-10-02 | DRG: 483 | Disposition: A | Discharge status: Home / Self Care | Payer: BC Managed Care – PPO | Source: Ambulatory Visit | Attending: Orthopedic Surgery | Admitting: Orthopedic Surgery

## 2013-09-30 ENCOUNTER — Encounter (HOSPITAL_COMMUNITY): Payer: BC Managed Care – PPO | Admitting: Vascular Surgery

## 2013-09-30 ENCOUNTER — Inpatient Hospital Stay (HOSPITAL_COMMUNITY): Payer: BC Managed Care – PPO | Admitting: Anesthesiology

## 2013-09-30 ENCOUNTER — Encounter (HOSPITAL_COMMUNITY)
Admission: RE | Disposition: A | Discharge status: Home / Self Care | Payer: Self-pay | Source: Ambulatory Visit | Attending: Orthopedic Surgery

## 2013-09-30 DIAGNOSIS — I10 Essential (primary) hypertension: Secondary | ICD-10-CM | POA: Diagnosis present

## 2013-09-30 DIAGNOSIS — K219 Gastro-esophageal reflux disease without esophagitis: Secondary | ICD-10-CM | POA: Diagnosis present

## 2013-09-30 DIAGNOSIS — Z7982 Long term (current) use of aspirin: Secondary | ICD-10-CM

## 2013-09-30 DIAGNOSIS — M19012 Primary osteoarthritis, left shoulder: Secondary | ICD-10-CM

## 2013-09-30 DIAGNOSIS — F3289 Other specified depressive episodes: Secondary | ICD-10-CM | POA: Diagnosis present

## 2013-09-30 DIAGNOSIS — Z79899 Other long term (current) drug therapy: Secondary | ICD-10-CM

## 2013-09-30 DIAGNOSIS — M19019 Primary osteoarthritis, unspecified shoulder: Principal | ICD-10-CM | POA: Diagnosis present

## 2013-09-30 DIAGNOSIS — Z87891 Personal history of nicotine dependence: Secondary | ICD-10-CM

## 2013-09-30 DIAGNOSIS — F329 Major depressive disorder, single episode, unspecified: Secondary | ICD-10-CM | POA: Diagnosis present

## 2013-09-30 HISTORY — DX: Primary osteoarthritis, left shoulder: M19.012

## 2013-09-30 HISTORY — PX: TOTAL SHOULDER ARTHROPLASTY: SHX126

## 2013-09-30 LAB — CBC
HCT: 30.9 % — ABNORMAL LOW (ref 36.0–46.0)
Hemoglobin: 10.2 g/dL — ABNORMAL LOW (ref 12.0–15.0)
MCH: 31.4 pg (ref 26.0–34.0)
MCHC: 33 g/dL (ref 30.0–36.0)
MCV: 95.1 fL (ref 78.0–100.0)
PLATELETS: 240 10*3/uL (ref 150–400)
RBC: 3.25 MIL/uL — ABNORMAL LOW (ref 3.87–5.11)
RDW: 13.1 % (ref 11.5–15.5)
WBC: 13.1 10*3/uL — AB (ref 4.0–10.5)

## 2013-09-30 LAB — CREATININE, SERUM
CREATININE: 0.6 mg/dL (ref 0.50–1.10)
GFR calc non Af Amer: >90 mL/min (ref >90)

## 2013-09-30 SURGERY — ARTHROPLASTY, SHOULDER, TOTAL
Anesthesia: General | Site: Shoulder | Laterality: Left

## 2013-09-30 MED ORDER — ALUM & MAG HYDROXIDE-SIMETH 200-200-20 MG/5ML PO SUSP: 30.0000 mL | ORAL | Status: DC | PRN

## 2013-09-30 MED ORDER — FENTANYL CITRATE 0.05 MG/ML IJ SOLN
INTRAMUSCULAR | Status: DC | PRN
  Administered 2013-09-30 (×2): 50 ug via INTRAVENOUS

## 2013-09-30 MED ORDER — ONDANSETRON HCL 4 MG/2ML IJ SOLN: 4.0000 mg | Freq: Once | INTRAMUSCULAR | Status: DC | PRN

## 2013-09-30 MED ORDER — PANTOPRAZOLE SODIUM 40 MG PO TBEC
40.0000 mg | DELAYED_RELEASE_TABLET | Freq: Every day | ORAL | Status: DC
  Administered 2013-10-01 – 2013-10-02 (×2): 40 mg via ORAL
  Filled 2013-09-30 (×2): qty 1

## 2013-09-30 MED ORDER — BISACODYL 10 MG RE SUPP: 10.0000 mg | Freq: Every day | RECTAL | Status: DC | PRN

## 2013-09-30 MED ORDER — ONDANSETRON HCL 4 MG PO TABS: 4.0000 mg | ORAL_TABLET | Freq: Four times a day (QID) | ORAL | Status: DC | PRN

## 2013-09-30 MED ORDER — METHOCARBAMOL 500 MG PO TABS: 500.0000 mg | ORAL_TABLET | Freq: Four times a day (QID) | ORAL | Status: DC

## 2013-09-30 MED ORDER — ALBUTEROL SULFATE HFA 108 (90 BASE) MCG/ACT IN AERS: 2.0000 | INHALATION_SPRAY | RESPIRATORY_TRACT | Status: DC | PRN

## 2013-09-30 MED ORDER — ZOLPIDEM TARTRATE 5 MG PO TABS: 5.0000 mg | ORAL_TABLET | Freq: Every evening | ORAL | Status: DC | PRN

## 2013-09-30 MED ORDER — METOCLOPRAMIDE HCL 10 MG PO TABS: 5.0000 mg | ORAL_TABLET | Freq: Three times a day (TID) | ORAL | Status: DC | PRN

## 2013-09-30 MED ORDER — OXYCODONE HCL 5 MG PO TABS
ORAL_TABLET | ORAL | Status: AC
  Filled 2013-09-30: qty 1

## 2013-09-30 MED ORDER — PHENYLEPHRINE HCL 10 MG/ML IJ SOLN
10.0000 mg | INTRAVENOUS | Status: DC | PRN
  Administered 2013-09-30: 15 ug/min via INTRAVENOUS

## 2013-09-30 MED ORDER — HYDROCHLOROTHIAZIDE 25 MG PO TABS
25.0000 mg | ORAL_TABLET | Freq: Every day | ORAL | Status: DC
  Administered 2013-09-30 – 2013-10-02 (×3): 25 mg via ORAL
  Filled 2013-09-30 (×3): qty 1

## 2013-09-30 MED ORDER — HYDROMORPHONE HCL PF 1 MG/ML IJ SOLN
0.5000 mg | INTRAMUSCULAR | Status: DC | PRN
  Administered 2013-10-01 (×4): 1 mg via INTRAVENOUS
  Filled 2013-09-30 (×4): qty 1

## 2013-09-30 MED ORDER — DIPHENHYDRAMINE HCL 12.5 MG/5ML PO ELIX: 12.5000 mg | ORAL_SOLUTION | ORAL | Status: DC | PRN

## 2013-09-30 MED ORDER — IRBESARTAN 300 MG PO TABS
300.0000 mg | ORAL_TABLET | Freq: Every day | ORAL | Status: DC
  Administered 2013-09-30 – 2013-10-02 (×3): 300 mg via ORAL
  Filled 2013-09-30 (×3): qty 1

## 2013-09-30 MED ORDER — NEOSTIGMINE METHYLSULFATE 1 MG/ML IJ SOLN
INTRAMUSCULAR | Status: DC | PRN
  Administered 2013-09-30: 4 mg via INTRAVENOUS

## 2013-09-30 MED ORDER — SENNA 8.6 MG PO TABS
1.0000 | ORAL_TABLET | Freq: Two times a day (BID) | ORAL | Status: DC
Start: 2013-09-30 — End: 2013-10-02
  Administered 2013-09-30 – 2013-10-02 (×3): 8.6 mg via ORAL
  Filled 2013-09-30 (×5): qty 1

## 2013-09-30 MED ORDER — 0.9 % SODIUM CHLORIDE (POUR BTL) OPTIME
TOPICAL | Status: DC | PRN
  Administered 2013-09-30: 1000 mL

## 2013-09-30 MED ORDER — METHOCARBAMOL 100 MG/ML IJ SOLN
500.0000 mg | Freq: Four times a day (QID) | INTRAMUSCULAR | Status: DC | PRN
  Filled 2013-09-30: qty 5

## 2013-09-30 MED ORDER — METHOCARBAMOL 500 MG PO TABS
ORAL_TABLET | ORAL | Status: AC
  Filled 2013-09-30: qty 1

## 2013-09-30 MED ORDER — MEPERIDINE HCL 25 MG/ML IJ SOLN: 6.2500 mg | INTRAMUSCULAR | Status: DC | PRN

## 2013-09-30 MED ORDER — DOCUSATE SODIUM 100 MG PO CAPS
100.0000 mg | ORAL_CAPSULE | Freq: Two times a day (BID) | ORAL | Status: DC
  Administered 2013-09-30 – 2013-10-02 (×4): 100 mg via ORAL
  Filled 2013-09-30 (×5): qty 1

## 2013-09-30 MED ORDER — METOCLOPRAMIDE HCL 5 MG/ML IJ SOLN: 5.0000 mg | Freq: Three times a day (TID) | INTRAMUSCULAR | Status: DC | PRN

## 2013-09-30 MED ORDER — ENOXAPARIN SODIUM 30 MG/0.3ML ~~LOC~~ SOLN
30.0000 mg | SUBCUTANEOUS | Status: DC
Start: 2013-09-30 — End: 2013-09-30
  Filled 2013-09-30: qty 0.3

## 2013-09-30 MED ORDER — ENOXAPARIN SODIUM 30 MG/0.3ML ~~LOC~~ SOLN
30.0000 mg | SUBCUTANEOUS | Status: DC
  Administered 2013-10-01 – 2013-10-02 (×2): 30 mg via SUBCUTANEOUS
  Filled 2013-09-30 (×3): qty 0.3

## 2013-09-30 MED ORDER — OXYCODONE HCL 5 MG/5ML PO SOLN: 5.0000 mg | Freq: Once | ORAL | Status: AC | PRN

## 2013-09-30 MED ORDER — POLYETHYLENE GLYCOL 3350 17 G PO PACK: 17.0000 g | PACK | Freq: Every day | ORAL | Status: DC | PRN

## 2013-09-30 MED ORDER — OXYCODONE HCL 5 MG PO TABS
5.0000 mg | ORAL_TABLET | Freq: Once | ORAL | Status: AC | PRN
Start: 2013-09-30 — End: 2013-09-30
  Administered 2013-09-30: 5 mg via ORAL

## 2013-09-30 MED ORDER — HYDROMORPHONE HCL PF 1 MG/ML IJ SOLN
INTRAMUSCULAR | Status: AC
  Filled 2013-09-30: qty 1

## 2013-09-30 MED ORDER — GLYCOPYRROLATE 0.2 MG/ML IJ SOLN
INTRAMUSCULAR | Status: DC | PRN
  Administered 2013-09-30: 0.6 mg via INTRAVENOUS

## 2013-09-30 MED ORDER — ARTIFICIAL TEARS OP OINT
TOPICAL_OINTMENT | OPHTHALMIC | Status: DC | PRN
  Administered 2013-09-30: 1 via OPHTHALMIC

## 2013-09-30 MED ORDER — HYDROMORPHONE HCL PF 1 MG/ML IJ SOLN
0.2500 mg | INTRAMUSCULAR | Status: DC | PRN
  Administered 2013-09-30 (×2): 0.5 mg via INTRAVENOUS

## 2013-09-30 MED ORDER — CALCIUM-VITAMIN D 600-400 MG-UNIT PO TABS: 1.0000 | ORAL_TABLET | Freq: Two times a day (BID) | ORAL | Status: DC

## 2013-09-30 MED ORDER — ATORVASTATIN CALCIUM 40 MG PO TABS
40.0000 mg | ORAL_TABLET | Freq: Every evening | ORAL | Status: DC
  Administered 2013-09-30 – 2013-10-01 (×2): 40 mg via ORAL
  Filled 2013-09-30 (×3): qty 1

## 2013-09-30 MED ORDER — ALBUTEROL SULFATE (2.5 MG/3ML) 0.083% IN NEBU: 2.5000 mg | INHALATION_SOLUTION | RESPIRATORY_TRACT | Status: DC | PRN

## 2013-09-30 MED ORDER — ONDANSETRON HCL 4 MG/2ML IJ SOLN
4.0000 mg | Freq: Four times a day (QID) | INTRAMUSCULAR | Status: DC | PRN
  Administered 2013-10-01: 4 mg via INTRAVENOUS
  Filled 2013-09-30: qty 2

## 2013-09-30 MED ORDER — SODIUM CHLORIDE 0.9 % IR SOLN
Status: DC | PRN
  Administered 2013-09-30: 3000 mL

## 2013-09-30 MED ORDER — PROMETHAZINE HCL 25 MG PO TABS: 25.0000 mg | ORAL_TABLET | Freq: Four times a day (QID) | ORAL | Status: DC | PRN

## 2013-09-30 MED ORDER — ACETAMINOPHEN 325 MG PO TABS: 650.0000 mg | ORAL_TABLET | Freq: Four times a day (QID) | ORAL | Status: DC | PRN

## 2013-09-30 MED ORDER — VENLAFAXINE HCL ER 37.5 MG PO CP24
37.5000 mg | ORAL_CAPSULE | Freq: Every day | ORAL | Status: DC
  Administered 2013-10-01 – 2013-10-02 (×2): 37.5 mg via ORAL
  Filled 2013-09-30 (×2): qty 1

## 2013-09-30 MED ORDER — OXYCODONE HCL 5 MG PO TABS: 5.0000 mg | ORAL_TABLET | ORAL | Status: DC | PRN

## 2013-09-30 MED ORDER — ASPIRIN EC 81 MG PO TBEC
81.0000 mg | DELAYED_RELEASE_TABLET | Freq: Every day | ORAL | Status: DC
  Filled 2013-09-30: qty 1

## 2013-09-30 MED ORDER — SENNA-DOCUSATE SODIUM 8.6-50 MG PO TABS: 2.0000 | ORAL_TABLET | Freq: Every day | ORAL | Status: AC

## 2013-09-30 MED ORDER — CLINDAMYCIN PHOSPHATE 600 MG/50ML IV SOLN
600.0000 mg | Freq: Four times a day (QID) | INTRAVENOUS | Status: AC
  Administered 2013-09-30 – 2013-10-01 (×3): 600 mg via INTRAVENOUS
  Filled 2013-09-30 (×3): qty 50

## 2013-09-30 MED ORDER — MIDAZOLAM HCL 5 MG/5ML IJ SOLN
INTRAMUSCULAR | Status: DC | PRN
  Administered 2013-09-30 (×2): 1 mg via INTRAVENOUS

## 2013-09-30 MED ORDER — PHENOL 1.4 % MT LIQD: 1.0000 | OROMUCOSAL | Status: DC | PRN

## 2013-09-30 MED ORDER — PHENYLEPHRINE HCL 10 MG/ML IJ SOLN
INTRAMUSCULAR | Status: DC | PRN
  Administered 2013-09-30: 80 ug via INTRAVENOUS
  Administered 2013-09-30: 40 ug via INTRAVENOUS
  Administered 2013-09-30: 80 ug via INTRAVENOUS

## 2013-09-30 MED ORDER — POTASSIUM CHLORIDE IN NACL 20-0.45 MEQ/L-% IV SOLN
INTRAVENOUS | Status: DC
  Administered 2013-09-30: via INTRAVENOUS
  Filled 2013-09-30 (×5): qty 1000

## 2013-09-30 MED ORDER — MENTHOL 3 MG MT LOZG: 1.0000 | LOZENGE | OROMUCOSAL | Status: DC | PRN

## 2013-09-30 MED ORDER — ROCURONIUM BROMIDE 100 MG/10ML IV SOLN
INTRAVENOUS | Status: DC | PRN
  Administered 2013-09-30: 50 mg via INTRAVENOUS

## 2013-09-30 MED ORDER — BUPIVACAINE-EPINEPHRINE PF 0.5-1:200000 % IJ SOLN
INTRAMUSCULAR | Status: DC | PRN
  Administered 2013-09-30: 30 mL via PERINEURAL

## 2013-09-30 MED ORDER — OXYCODONE-ACETAMINOPHEN 10-325 MG PO TABS: 1.0000 | ORAL_TABLET | Freq: Four times a day (QID) | ORAL | Status: DC | PRN

## 2013-09-30 MED ORDER — ONDANSETRON HCL 4 MG/2ML IJ SOLN
INTRAMUSCULAR | Status: DC | PRN
  Administered 2013-09-30: 4 mg via INTRAVENOUS

## 2013-09-30 MED ORDER — METHOCARBAMOL 500 MG PO TABS
500.0000 mg | ORAL_TABLET | Freq: Four times a day (QID) | ORAL | Status: DC | PRN
  Administered 2013-09-30 – 2013-10-02 (×6): 500 mg via ORAL
  Filled 2013-09-30 (×5): qty 1

## 2013-09-30 MED ORDER — ACETAMINOPHEN 650 MG RE SUPP: 650.0000 mg | Freq: Four times a day (QID) | RECTAL | Status: DC | PRN

## 2013-09-30 MED ORDER — ASPIRIN EC 81 MG PO TBEC
81.0000 mg | DELAYED_RELEASE_TABLET | Freq: Every day | ORAL | Status: DC
  Administered 2013-10-01 – 2013-10-02 (×2): 81 mg via ORAL
  Filled 2013-09-30 (×2): qty 1

## 2013-09-30 MED ORDER — LACTATED RINGERS IV SOLN
INTRAVENOUS | Status: DC | PRN
  Administered 2013-09-30 (×2): via INTRAVENOUS

## 2013-09-30 MED ORDER — PROPOFOL 10 MG/ML IV BOLUS
INTRAVENOUS | Status: DC | PRN
  Administered 2013-09-30: 175 mg via INTRAVENOUS

## 2013-09-30 MED ORDER — VALSARTAN-HYDROCHLOROTHIAZIDE 320-25 MG PO TABS: 1.0000 | ORAL_TABLET | Freq: Every day | ORAL | Status: DC

## 2013-09-30 MED ORDER — MIDAZOLAM HCL 2 MG/2ML IJ SOLN
INTRAMUSCULAR | Status: AC
  Administered 2013-09-30: 2 mg
  Filled 2013-09-30: qty 2

## 2013-09-30 MED ORDER — OXYCODONE-ACETAMINOPHEN 5-325 MG PO TABS
1.0000 | ORAL_TABLET | ORAL | Status: DC | PRN
  Administered 2013-09-30: 2 via ORAL
  Administered 2013-09-30: 1 via ORAL
  Administered 2013-10-01 – 2013-10-02 (×7): 2 via ORAL
  Filled 2013-09-30: qty 1
  Filled 2013-09-30: qty 2
  Filled 2013-09-30: qty 1
  Filled 2013-09-30 (×7): qty 2

## 2013-09-30 MED ORDER — CALCIUM CARBONATE-VITAMIN D 500-200 MG-UNIT PO TABS
1.0000 | ORAL_TABLET | Freq: Two times a day (BID) | ORAL | Status: DC
  Administered 2013-10-01 – 2013-10-02 (×2): 1 via ORAL
  Filled 2013-09-30 (×5): qty 1

## 2013-09-30 MED ORDER — FENTANYL CITRATE 0.05 MG/ML IJ SOLN
INTRAMUSCULAR | Status: AC
  Administered 2013-09-30: 100 ug
  Filled 2013-09-30: qty 2

## 2013-09-30 MED ORDER — LACTATED RINGERS IV SOLN
INTRAVENOUS | Status: DC
  Administered 2013-09-30: 50 mL/h via INTRAVENOUS

## 2013-09-30 MED ORDER — CARVEDILOL 25 MG PO TABS
25.0000 mg | ORAL_TABLET | Freq: Two times a day (BID) | ORAL | Status: DC
  Administered 2013-10-01 – 2013-10-02 (×3): 25 mg via ORAL
  Filled 2013-09-30 (×5): qty 1

## 2013-09-30 SURGICAL SUPPLY — 66 items
APL SKNCLS STERI-STRIP NONHPOA (GAUZE/BANDAGES/DRESSINGS) ×1
BENZOIN TINCTURE PRP APPL 2/3 (GAUZE/BANDAGES/DRESSINGS) ×3 IMPLANT
BLADE SAW SGTL MED 73X18.5 STR (BLADE) ×3 IMPLANT
BOOTCOVER CLEANROOM LRG (PROTECTIVE WEAR) ×6 IMPLANT
BOWL SMART MIX CTS (DISPOSABLE) ×2 IMPLANT
BRUSH FEMORAL CANAL (MISCELLANEOUS) IMPLANT
CAP TOTAL SHOULDER ×2 IMPLANT
CEMENT BONE DEPUY (Cement) ×3 IMPLANT
CLOSURE STERI-STRIP 1/2X4 (GAUZE/BANDAGES/DRESSINGS) ×1
CLOTH BEACON ORANGE TIMEOUT ST (SAFETY) ×3 IMPLANT
CLSR STERI-STRIP ANTIMIC 1/2X4 (GAUZE/BANDAGES/DRESSINGS) ×2 IMPLANT
COVER SURGICAL LIGHT HANDLE (MISCELLANEOUS) ×3 IMPLANT
COVER TABLE BACK 60X90 (DRAPES) IMPLANT
DRAPE C-ARM 42X72 X-RAY (DRAPES) IMPLANT
DRAPE INCISE IOBAN 66X45 STRL (DRAPES) ×3 IMPLANT
DRAPE U-SHAPE 47X51 STRL (DRAPES) ×3 IMPLANT
DRSG MEPILEX BORDER 4X8 (GAUZE/BANDAGES/DRESSINGS) ×2 IMPLANT
DRSG PAD ABDOMINAL 8X10 ST (GAUZE/BANDAGES/DRESSINGS) ×3 IMPLANT
DURAPREP 26ML APPLICATOR (WOUND CARE) ×3 IMPLANT
ELECT BLADE 6.5 EXT (BLADE) IMPLANT
ELECT NDL TIP 2.8 STRL (NEEDLE) IMPLANT
ELECT NEEDLE TIP 2.8 STRL (NEEDLE) IMPLANT
ELECT REM PT RETURN 9FT ADLT (ELECTROSURGICAL) ×3
ELECTRODE REM PT RTRN 9FT ADLT (ELECTROSURGICAL) ×1 IMPLANT
EVACUATOR 1/8 PVC DRAIN (DRAIN) IMPLANT
FACESHIELD LNG OPTICON STERILE (SAFETY) IMPLANT
GLOVE BIOGEL PI ORTHO PRO SZ8 (GLOVE) ×4
GLOVE ORTHO TXT STRL SZ7.5 (GLOVE) ×3 IMPLANT
GLOVE PI ORTHO PRO STRL SZ8 (GLOVE) ×2 IMPLANT
GLOVE SURG ORTHO 8.0 STRL STRW (GLOVE) ×6 IMPLANT
GOWN BRE IMP PREV XXLGXLNG (GOWN DISPOSABLE) ×3 IMPLANT
GOWN STRL REIN XL XLG (GOWN DISPOSABLE) ×2 IMPLANT
HANDPIECE INTERPULSE COAX TIP (DISPOSABLE) ×3
HOOD PEEL AWAY FACE SHEILD DIS (HOOD) ×6 IMPLANT
KIT BASIN OR (CUSTOM PROCEDURE TRAY) ×3 IMPLANT
KIT ROOM TURNOVER OR (KITS) ×3 IMPLANT
MANIFOLD NEPTUNE II (INSTRUMENTS) ×3 IMPLANT
NDL 1/2 CIR CATGUT .05X1.09 (NEEDLE) ×1 IMPLANT
NDL HYPO 25GX1X1/2 BEV (NEEDLE) IMPLANT
NEEDLE 1/2 CIR CATGUT .05X1.09 (NEEDLE) ×3 IMPLANT
NEEDLE HYPO 25GX1X1/2 BEV (NEEDLE) IMPLANT
NS IRRIG 1000ML POUR BTL (IV SOLUTION) ×3 IMPLANT
PACK SHOULDER (CUSTOM PROCEDURE TRAY) ×3 IMPLANT
PAD ARMBOARD 7.5X6 YLW CONV (MISCELLANEOUS) ×6 IMPLANT
SET HNDPC FAN SPRY TIP SCT (DISPOSABLE) IMPLANT
SLING ARM IMMOBILIZER LRG (SOFTGOODS) IMPLANT
SLING ARM IMMOBILIZER MED (SOFTGOODS) ×2 IMPLANT
SMARTMIX MINI TOWER (MISCELLANEOUS) ×3
SPONGE GAUZE 4X4 12PLY (GAUZE/BANDAGES/DRESSINGS) ×3 IMPLANT
SPONGE LAP 18X18 X RAY DECT (DISPOSABLE) ×3 IMPLANT
SUCTION FRAZIER TIP 10 FR DISP (SUCTIONS) ×3 IMPLANT
SUPPORT WRAP ARM LG (MISCELLANEOUS) ×3 IMPLANT
SUT FIBERWIRE #2 38 REV NDL BL (SUTURE) ×15
SUT MNCRL AB 4-0 PS2 18 (SUTURE) ×3 IMPLANT
SUT VIC AB 0 CT1 27 (SUTURE) ×3
SUT VIC AB 0 CT1 27XBRD ANBCTR (SUTURE) ×1 IMPLANT
SUT VIC AB 2-0 CT1 27 (SUTURE)
SUT VIC AB 2-0 CT1 TAPERPNT 27 (SUTURE) IMPLANT
SUT VIC AB 3-0 SH 18 (SUTURE) ×3 IMPLANT
SUTURE FIBERWR#2 38 REV NDL BL (SUTURE) ×5 IMPLANT
SYR CONTROL 10ML LL (SYRINGE) IMPLANT
TOWEL OR 17X24 6PK STRL BLUE (TOWEL DISPOSABLE) ×3 IMPLANT
TOWEL OR 17X26 10 PK STRL BLUE (TOWEL DISPOSABLE) ×3 IMPLANT
TOWER SMARTMIX MINI (MISCELLANEOUS) IMPLANT
TRAY FOLEY CATH 16FRSI W/METER (SET/KITS/TRAYS/PACK) IMPLANT
WATER STERILE IRR 1000ML POUR (IV SOLUTION) ×3 IMPLANT

## 2013-09-30 NOTE — H&P (Signed)
PREOPERATIVE H&P  Chief Complaint: DJD LEFT SHOULDER  HPI: Madeline Clarke is a 63 y.o. female who presents for preoperative history and physical with a diagnosis of DJD LEFT SHOULDER. Symptoms are rated as moderate to severe, and have been worsening.  This is significantly impairing activities of daily living.  She has elected for surgical management. Failed injections, nsaids, activity modification.  Past Medical History  Diagnosis Date  . Fibroid   . Hypertension   . GERD (gastroesophageal reflux disease)   . Depression   . Elevated cholesterol   . 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 Carpal tunnel release  7/11    rt ctr  . Tubal ligation    . Abdominal hysterectomy      TAH  . Microsurgical tubal reanastomosis    . Left salpingectomy    . Tonsillectomy    . Back surgery  2012  . Eye surgery      strabismisx3-age 24-14-15  . Carpal tunnel release  06/14/2012    Procedure: CARPAL TUNNEL RELEASE;  Surgeon: Cammie Sickle., MD;  Location: [facility];  Service: Orthopedics;  Laterality: Left;  left carpal tunnel release  . Steriod injection  06/14/2012    Procedure: STEROID INJECTION;  Surgeon: Cammie Sickle., MD;  Location: [place];  Service: Orthopedics;  Laterality: Left;  Inject bilateral CMC joints  . Breast surgery    . Breast biopsy     History   Social History  . Marital Status: Married    Spouse Name: N/A    Number of Children: N/A  . Years of Education: N/A   Social History Main Topics  . Smoking status: Former Smoker    Quit date: 06/11/1997  . Smokeless tobacco: None  . Alcohol Use: 10.0 oz/week    20 drink(s) per week  . Drug Use: No  . Sexual Activity: No   Other Topics Concern  . None   Social History Narrative  . None   Family History  Problem Relation Age of Onset  . Hypertension Mother   . Hypertension Sister   . Hypertension Brother     Allergies  Allergen Reactions  . Bee Venom Swelling    Throat swelling  . Amlodipine Swelling  . Penicillins Other (See Comments)    Childhood allergy   . Shellfish Allergy Hives   Prior to Admission medications   Medication Sig Start Date End Date Taking? Authorizing Provider  aspirin EC 81 MG tablet Take 81 mg by mouth daily.   Yes Historical Provider, MD  atorvastatin (LIPITOR) 40 MG tablet Take 40 mg by mouth every evening.   Yes Historical Provider, MD  Calcium Carb-Cholecalciferol (CALCIUM-VITAMIN D) 600-400 MG-UNIT TABS Take 1 tablet by mouth 2 (two) times daily.   Yes Historical Provider, MD  carvedilol (COREG) 25 MG tablet Take 25 mg by mouth 2 (two) times daily.   Yes Historical Provider, MD  Cholecalciferol (VITAMIN D3) 2000 UNITS TABS Take 2,000 Units by mouth every morning.   Yes Historical Provider, MD  glucosamine-chondroitin 500-400 MG tablet Take 1 tablet by mouth daily.   Yes Historical Provider, MD  meloxicam (MOBIC) 15 MG tablet Take 15 mg by mouth daily.   Yes Historical Provider, MD  Omega-3 Fatty Acids (FISH OIL PO) Take 1 capsule by mouth 2 (two) times daily before lunch and supper.    Yes Historical Provider, MD  omeprazole (PRILOSEC) 40  MG capsule Take 40 mg by mouth daily.   Yes Historical Provider, MD  valsartan-hydrochlorothiazide (DIOVAN-HCT) 320-25 MG per tablet Take 1 tablet by mouth daily.   Yes Historical Provider, MD  venlafaxine XR (EFFEXOR-XR) 37.5 MG 24 hr capsule Take 37.5 mg by mouth daily.   Yes Historical Provider, MD  albuterol (PROVENTIL HFA;VENTOLIN HFA) 108 (90 BASE) MCG/ACT inhaler Inhale 2 puffs into the lungs as needed for shortness of breath (emergency use only).    Historical Provider, MD  methocarbamol (ROBAXIN) 500 MG tablet Take 1 tablet (500 mg total) by mouth 4 (four) times daily. 09/30/13   Johnny Bridge, MD  oxyCODONE-acetaminophen (PERCOCET) 10-325 MG per tablet Take 1-2 tablets by mouth every 6 (six) hours as needed for pain.  MAXIMUM TOTAL ACETAMINOPHEN DOSE IS 4000 MG PER DAY 09/30/13   Johnny Bridge, MD  promethazine (PHENERGAN) 25 MG tablet Take 1 tablet (25 mg total) by mouth every 6 (six) hours as needed for nausea or vomiting. 09/30/13   Johnny Bridge, MD  sennosides-docusate sodium (SENOKOT-S) 8.6-50 MG tablet Take 2 tablets by mouth daily. 09/30/13   Johnny Bridge, MD     Positive ROS: All other systems have been reviewed and were otherwise negative with the exception of those mentioned in the HPI and as above.  Physical Exam: General: Alert, no acute distress Cardiovascular: No pedal edema Respiratory: No cyanosis, no use of accessory musculature GI: No organomegaly, abdomen is soft and non-tender Skin: No lesions in the area of chief complaint Neurologic: Sensation intact distally Psychiatric: Patient is competent for consent with normal mood and affect Lymphatic: No axillary or cervical lymphadenopathy  MUSCULOSKELETAL: left shoulder AROM 0-80 deg, er 0, cuff intact.  Assessment: DJD LEFT SHOULDER  Plan: Plan for Procedure(s): LEFT TOTAL SHOULDER ARTHROPLASTY  The risks benefits and alternatives were discussed with the patient including but not limited to the risks of nonoperative treatment, versus surgical intervention including infection, bleeding, nerve injury,  blood clots, cardiopulmonary complications, morbidity, mortality, among others, and they were willing to proceed.   Johnny Bridge, MD Cell [phone]   09/30/2013 11:19 PM

## 2013-09-30 NOTE — Transfer of Care (Signed)
Immediate Anesthesia Transfer of Care Note  Patient: Madeline Clarke  Procedure(s) Performed: Procedure(s): LEFT TOTAL SHOULDER ARTHROPLASTY (Left)  Patient Location: PACU  Anesthesia Type:General  Level of Consciousness: awake, alert  and oriented  Airway & Oxygen Therapy: Patient Spontanous Breathing and Patient connected to nasal cannula oxygen  Post-op Assessment: Report given to PACU RN and Post -op Vital signs reviewed and stable  Post vital signs: Reviewed and stable  Complications: No apparent anesthesia complications

## 2013-09-30 NOTE — Discharge Instructions (Signed)
Diet: As you were doing prior to hospitalization   Shower:  May shower but keep the wounds dry, use an occlusive plastic wrap, NO SOAKING IN TUB.  If the bandage gets wet, change with a clean dry gauze.  Dressing:  You may change your dressing 3-5 days after surgery.  Then change the dressing daily with sterile gauze dressing.    There are sticky tapes (steri-strips) on your wounds and all the stitches are absorbable.  Leave the steri-strips in place when changing your dressings, they will peel off with time, usually 2-3 weeks.  Activity:  Increase activity slowly as tolerated, but follow the weight bearing instructions below.  No lifting or driving for 6 weeks.  Weight Bearing:   Sling at all times..    To prevent constipation: you may use a stool softener such as -  Colace (over the counter) 100 mg by mouth twice a day  Drink plenty of fluids (prune juice may be helpful) and high fiber foods Miralax (over the counter) for constipation as needed.    Itching:  If you experience itching with your medications, try taking only a single pain pill, or even half a pain pill at a time.  You may take up to 10 pain pills per day, and you can also use benadryl over the counter for itching or also to help with sleep.   Precautions:  If you experience chest pain or shortness of breath - call 911 immediately for transfer to the hospital emergency department!!  If you develop a fever greater that 101 F, purulent drainage from wound, increased redness or drainage from wound, or calf pain -- Call the office at [phone]                                                Follow- Up Appointment:  Please call for an appointment to be seen in 2 weeks [place] - [phone]

## 2013-09-30 NOTE — Op Note (Signed)
09/30/2013  12:58 PM  PATIENT:  Madeline Clarke    PRE-OPERATIVE DIAGNOSIS:  DJD LEFT SHOULDER  POST-OPERATIVE DIAGNOSIS:  Same  PROCEDURE:  LEFT TOTAL SHOULDER ARTHROPLASTY  SURGEON:  Johnny Bridge, MD  PHYSICIAN ASSISTANT: Joya Gaskins, OPA-C, present and scrubbed throughout the case, critical for completion in a timely fashion, and for retraction, instrumentation, and closure.  Second assistant: Selmer Dominion, PA-S  ANESTHESIA:   General  PREOPERATIVE INDICATIONS:  Madeline Clarke is a  63 y.o. female with a diagnosis of DJD LEFT SHOULDER who failed conservative measures and elected for surgical management.    The risks benefits and alternatives were discussed with the patient preoperatively including but not limited to the risks of infection, bleeding, nerve injury, cardiopulmonary complications, the need for revision surgery, dislocation, loosening, incomplete relief of pain, among others, and the patient was willing to proceed.   OPERATIVE IMPLANTS: Biomet size 5 mini press-fit humeral stem, size 42x18 Versa-dial humeral head, set in the D position with increased coverage posteriorly, with a small cemented glenoid polyethylene 3 peg implant with a central regenerex noncemented post.   OPERATIVE FINDINGS: Advanced glenohumeral osteoarthritis involving the glenoid and the humeral head with substantial osteophyte formation inferiorly.  Her glenoid was extremely small, and barely fit the small component.   OPERATIVE PROCEDURE: The patient was brought to the operating room and placed in the supine position. General anesthesia was administered. IV antibiotics were given.  The upper extremity was prepped and draped in usual sterile fashion. The patient was in a beachchair position with all bony prominences padded.   Time out was performed and a deltopectoral approach was carried out. The biceps tendon was tenodesed to the pectoralis tendon. The subscapularis was released, tagging it with  a #2 FiberWire, leaving a cuff of tendon for repair.   The inferior osteophyte was removed, and release of the capsule off of the humeral side was completed. The head was dislocated, and I reamed sequentially. I placed the humeral cutting guide at 30 of retroversion, and then pinned this into place, and made my humeral neck cut. This was at the appropriate level.   I then placed deep retractors and exposed the glenoid. I excised the labrum circumferentially, taking care to protect the axillary nerve inferiorly.   I then placed a guidewire into the center position, controlling appropriate version and inclination. I then reamed over the guidewire with the small reamer, and was satisfied with the preparation. I preserved the subchondral bone in order to maximize the strength and minimize the risk for subsequent subsidence.   I then drilled the central hole for the regenerex peg, and then placed the guide, and then drilled the 3 peripheral peg holes. I had excellent bony circumferential contact. I did have 3 bony endpoints on the tunnels of all of the drill holes and the center.  I then cleaned the glenoid, irrigated it copiously, and then dried it and cemented the prosthesis into place.  Exposure was challenging, because it was so small, however the glenoid component did not rock when palpated, and seemed to have complete seating.  Because she was so small, I could not actually visualize the pegs going into the glenoid, but perform this maneuver by feel.  The cement cured, and then I turned my attention to the humeral side.   I sequentially broached, up to the selected size, with the broach set at 30 of retroversion. I then placed the real stem. I trialed with multiple heads, and the  above-named component was selected. Increased posterior coverage improved the coverage. The soft tissue tension was appropriate.   I then impacted the real humeral head into place, reduced the head, and irrigated copiously.  Excellent stability and range of motion was achieved. I repaired the subscapularis with 4 #2 FiberWire, as well as the rotator interval, and irrigated copiously once more. The subcutaneous tissue was closed with Vicryl including the deltopectoral fascia.   The skin was closed with Steri-Strips and sterile gauze was applied. She had a preoperative nerve block. She tolerated the procedure well and there were no complications.

## 2013-09-30 NOTE — Progress Notes (Signed)
Orthopedic Tech Progress Note Patient Details:  Madeline Clarke [birth year] [id number] Has ohf     [name] 09/30/2013, 8:18 PM

## 2013-09-30 NOTE — Anesthesia Procedure Notes (Addendum)
Anesthesia Regional Block:  Interscalene brachial plexus block  Pre-Anesthetic Checklist: ,, timeout performed, Correct Patient, Correct Site, Correct Laterality, Correct Procedure, Correct Position, site marked, Risks and benefits discussed,  Surgical consent,  Pre-op evaluation,  At surgeon's request and post-op pain management  Laterality: Left  Prep: chloraprep       Needles:  Injection technique: Single-shot  Needle Type: Echogenic Stimulator Needle     Needle Length: 5cm 5 cm Needle Gauge: 21 and 21 G    Additional Needles:  Procedures: ultrasound guided (picture in chart) and nerve stimulator Interscalene brachial plexus block  Nerve Stimulator or Paresthesia:  Response: 0.4 mA,   Additional Responses:   Narrative:  Start time: 09/30/2013 9:30 AM End time: 09/30/2013 9:40 AM Injection made incrementally with aspirations every 5 mL.  Performed by: Personally  Anesthesiologist: Lillia Abed MD  Additional Notes: Monitors applied. Patient sedated. Sterile prep and drape,hand hygiene and sterile gloves were used. Relevant anatomy identified.Needle position confirmed.Local anesthetic injected incrementally after negative aspiration. Local anesthetic spread visualized around nerve(s). Vascular puncture avoided. No complications. Image printed for medical record.The patient tolerated the procedure well.        Procedure Name: Intubation Date/Time: 09/30/2013 10:48 AM Performed by: Erik Obey Pre-anesthesia Checklist: Patient identified, Timeout performed, Emergency Drugs available, Suction available and Patient being monitored Patient Re-evaluated:Patient Re-evaluated prior to inductionOxygen Delivery Method: Circle system utilized Preoxygenation: Pre-oxygenation with 100% oxygen Intubation Type: IV induction Ventilation: Mask ventilation without difficulty Laryngoscope Size: Mac and 3 Grade View: Grade I Tube type: Oral Tube size: 7.5 mm Number of attempts:  1 Airway Equipment and Method: Stylet Placement Confirmation: ETT inserted through vocal cords under direct vision,  positive ETCO2 and breath sounds checked- equal and bilateral Secured at: 21 cm Tube secured with: Tape Dental Injury: Teeth and Oropharynx as per pre-operative assessment

## 2013-09-30 NOTE — Anesthesia Preprocedure Evaluation (Addendum)
Anesthesia Evaluation  Patient identified by MRN, date of birth, ID band Patient awake    Reviewed: Allergy & Precautions, H&P , NPO status , Patient's Chart, lab work & pertinent test results  Airway Mallampati: I TM Distance: >3 FB Neck ROM: Full    Dental   Pulmonary former smoker,          Cardiovascular hypertension, Pt. on medications and Pt. on home beta blockers     Neuro/Psych PSYCHIATRIC DISORDERS Depression    GI/Hepatic GERD-  Medicated,  Endo/Other    Renal/GU      Musculoskeletal   Abdominal   Peds  Hematology   Anesthesia Other Findings   Reproductive/Obstetrics                          Anesthesia Physical Anesthesia Plan  ASA: II  Anesthesia Plan: General   Post-op Pain Management:    Induction: Intravenous  Airway Management Planned: Oral ETT  Additional Equipment:   Intra-op Plan:   Post-operative Plan: Extubation in OR  Informed Consent: I have reviewed the patients History and Physical, chart, labs and discussed the procedure including the risks, benefits and alternatives for the proposed anesthesia with the patient or authorized representative who has indicated his/her understanding and acceptance.     Plan Discussed with: CRNA and Surgeon  Anesthesia Plan Comments:        Anesthesia Quick Evaluation

## 2013-09-30 NOTE — Anesthesia Postprocedure Evaluation (Signed)
Anesthesia Post Note  Patient: Madeline Clarke  Procedure(s) Performed: Procedure(s) (LRB): LEFT TOTAL SHOULDER ARTHROPLASTY (Left)  Anesthesia type: general  Patient location: PACU  Post pain: Pain level controlled  Post assessment: Patient's Cardiovascular Status Stable  Last Vitals:  Filed Vitals:   09/30/13 1428  BP:   Pulse: 76  Temp:   Resp: 16    Post vital signs: Reviewed and stable  Level of consciousness: sedated  Complications: No apparent anesthesia complications

## 2013-09-30 NOTE — Preoperative (Signed)
Beta Blockers   Reason not to administer Beta Blockers:Not Applicable 

## 2013-10-01 ENCOUNTER — Encounter (HOSPITAL_COMMUNITY): Payer: Self-pay | Admitting: General Practice

## 2013-10-01 LAB — BASIC METABOLIC PANEL
BUN: 12 mg/dL (ref 6–23)
CHLORIDE: 97 meq/L (ref 96–112)
CO2: 29 mEq/L (ref 19–32)
CREATININE: 0.63 mg/dL (ref 0.50–1.10)
Calcium: 8.8 mg/dL (ref 8.4–10.5)
Glucose, Bld: 108 mg/dL — ABNORMAL HIGH (ref 70–99)
Potassium: 3.6 mEq/L — ABNORMAL LOW (ref 3.7–5.3)
Sodium: 137 mEq/L (ref 137–147)

## 2013-10-01 LAB — CBC
HCT: 31.2 % — ABNORMAL LOW (ref 36.0–46.0)
Hemoglobin: 10.2 g/dL — ABNORMAL LOW (ref 12.0–15.0)
MCH: 31.4 pg (ref 26.0–34.0)
MCHC: 32.7 g/dL (ref 30.0–36.0)
MCV: 96 fL (ref 78.0–100.0)
PLATELETS: 246 10*3/uL (ref 150–400)
RBC: 3.25 MIL/uL — ABNORMAL LOW (ref 3.87–5.11)
RDW: 13.4 % (ref 11.5–15.5)
WBC: 10.2 10*3/uL (ref 4.0–10.5)

## 2013-10-01 MED ORDER — METOPROLOL TARTRATE 1 MG/ML IV SOLN
2.5000 mg | Freq: Once | INTRAVENOUS | Status: AC
  Administered 2013-10-01: 2.5 mg via INTRAVENOUS
  Filled 2013-10-01: qty 5

## 2013-10-01 NOTE — Progress Notes (Signed)
Patient ID: Madeline Clarke, female   DOB: 14-Nov-1950, 63 y.o.   MRN: [id number]     Subjective:  Patient reports pain as moderate.  She states she was not able to sleep last night and wants to stay another day for pain management.  Objective:   VITALS:   Filed Vitals:   09/30/13 1619 09/30/13 1720 09/30/13 2135 10/01/13 0551  BP:  118/67 117/59 178/90  Pulse: 80 83 87 84  Temp: 97.4 F (36.3 C) 98.7 F (37.1 C) 98.3 F (36.8 C) 98.8 F (37.1 C)  TempSrc:  Oral Oral Oral  Resp: 15 18 18 18   SpO2: 95% 94% 93% 96%    Neurologically intact Sensation intact distally Intact pulses distally Able to flex, extend, abduct, adduct fingers in left extremity.  Able to flex, extend, radial and ulnar wrist and make okay sign. Dressings clean, dry, intact.  BP this AM of 178/90.   Lab Results  Component Value Date   WBC 10.2 10/01/2013   HGB 10.2* 10/01/2013   HCT 31.2* 10/01/2013   MCV 96.0 10/01/2013   PLT 246 10/01/2013     Assessment/Plan: 1 Day Post-Op   Principal Problem:   Osteoarthritis of left shoulder Active Problems:   Primary localized osteoarthrosis, shoulder region Hypertension  Advance diet Up with therapy Plan for discharge tomorrow Given one dose of IV Metoprolol for elevated BP this AM.  Will monitor BP and if no improvement, consult cardiology.  Will have patient follow up with primary care provider for HTN after discharge.   Remonia Richter 10/01/2013, 9:12 AM   Marchia Bond, MD Cell [phone]

## 2013-10-01 NOTE — Progress Notes (Signed)
Utilization review completed.  

## 2013-10-01 NOTE — Evaluation (Signed)
Occupational Therapy Evaluation Patient Details Name: Madeline Clarke MRN: [id number] DOB: [birth year] Today's Date: 10/01/2013 Time: 8588-5027 OT Time Calculation (min): 40 min  OT Assessment / Plan / Recommendation History of present illness s/p L TSA due to arthritis   Clinical Impression   Pt limited by fatigue, pain, and nausea.  Instructed pt in ADL, sling use, and positioning L UE for comfort.  Husband not available for education.  Will follow while she is in the hospital.   OT Assessment  Patient needs continued OT Services    Follow Up Recommendations  No OT follow up    Barriers to Discharge      Equipment Recommendations  None recommended by OT    Recommendations for Other Services    Frequency  Min 2X/week    Precautions / Restrictions Precautions Precautions: Shoulder Type of Shoulder Precautions: sling at all times, order is for ADL training and sling ed Shoulder Interventions: Shoulder sling/immobilizer;At all times Precaution Booklet Issued: Yes (comment)   Pertinent Vitals/Pain L shoulder 7/10, premedicated, iced, repositioned    ADL  Eating/Feeding: Set up Where Assessed - Eating/Feeding: Bed level Grooming: Wash/dry hands;Supervision/safety Where Assessed - Grooming: Unsupported standing Upper Body Bathing: Minimal assistance Where Assessed - Upper Body Bathing: Unsupported sitting Lower Body Bathing: Minimal assistance Where Assessed - Lower Body Bathing: Unsupported sitting;Supported sit to stand Upper Body Dressing: Minimal assistance Where Assessed - Upper Body Dressing: Unsupported sitting Lower Body Dressing: Minimal assistance Where Assessed - Lower Body Dressing: Unsupported sitting;Supported sit to stand Toilet Transfer: Supervision/safety Toilet Transfer Method: Sit to Loss adjuster, chartered: Comfort height toilet Toileting - Clothing Manipulation and Hygiene: Modified independent Where Assessed - Best boy and  Hygiene: Sit to stand from 3-in-1 or toilet Transfers/Ambulation Related to ADLs: supervision to ambulate to bathroom ADL Comments: Instructed in hemitechniques for bathing and dressing, sling donning/doffing and positioning, positiong L UE in sitting and supine    OT Diagnosis: Generalized weakness;Acute pain  OT Problem List: Decreased range of motion;Decreased activity tolerance;Decreased knowledge of precautions;Impaired UE functional use;Pain OT Treatment Interventions: Self-care/ADL training;Patient/family education   OT Goals(Current goals can be found in the care plan section) Acute Rehab OT Goals Patient Stated Goal: L shoulder pain relief OT Goal Formulation: With patient Time For Goal Achievement: 10/08/13 Potential to Achieve Goals: Good ADL Goals Pt/caregiver will Perform Home Exercise Program: Left upper extremity;With Supervision (L elbow to hand AROM) Additional ADL Goal #1: Pt will verbalize understanding of bathing and dressing techniques without moving L shoulder. Additional ADL Goal #2: Pt/husband will be independent in donning and doffing sling. Additional ADL Goal #3: Pt/husband will be independent in positioning L UE in chair and in bed.  Visit Information  Last OT Received On: 10/01/13 Assistance Needed: +1 History of Present Illness: s/p L TSA due to arthritis       Prior [place] expects to be discharged to:: Private residence Living Arrangements: Spouse/significant other Available Help at Discharge: Family;Available 24 hours/day Type of Home: House Home Equipment: None Additional Comments: per pt, she is not to shower for 2 weeks Prior Function Level of Independence: Independent Comments: pt works as a Engineer, mining: No difficulties Dominant Hand: Right         Vision/Perception Vision - History Baseline Vision: Wears glasses all the time Patient Visual Report: No change from  baseline   Cognition  Cognition Arousal/Alertness: Awake/alert Behavior During Therapy: WFL for tasks assessed/performed Overall Cognitive Status:  Within Functional Limits for tasks assessed    Extremity/Trunk Assessment Upper Extremity Assessment Upper Extremity Assessment: LUE deficits/detail LUE Deficits / Details: full AROM elbow to hand, no shoulder movement allowed LUE Coordination: decreased gross motor Lower Extremity Assessment Lower Extremity Assessment: Overall WFL for tasks assessed Cervical / Trunk Assessment Cervical / Trunk Assessment: Normal     Mobility Bed Mobility Overal bed mobility: Modified Independent General bed mobility comments: HOB up Transfers Overall transfer level: Needs assistance Equipment used: None Transfers: Sit to/from Stand Sit to Stand: Supervision     Exercise     Balance     End of Session OT - End of Session Activity Tolerance: Patient limited by pain (pt with nausea) Patient left: in bed;with call bell/phone within reach Nurse Communication: Mobility status  GO     Malka So 10/01/2013, 1:30 PM [phone]

## 2013-10-02 NOTE — Progress Notes (Signed)
Pt discharged home. D/c instructions given, no questions verbalized. Vitals stable.

## 2013-10-02 NOTE — Progress Notes (Signed)
Occupational Therapy Treatment and Discharge Patient Details Name: Madeline Clarke MRN: [id number] DOB: 02-04-1951 Today's Date: 10/02/2013 Time: 0300-9233 OT Time Calculation (min): 32 min  OT Assessment / Plan / Recommendation  History of present illness s/p L TSA due to arthritis   OT comments  All education completed with patient and husband. Pt has been walking in the halls.  Nausea has resolved and pt states she is ready to go home today. Goals are met.  Signing off.  Follow Up Recommendations  No OT follow up    Barriers to Discharge       Equipment Recommendations  None recommended by OT    Recommendations for Other Services    Frequency Min 2X/week   Progress towards OT Goals Progress towards OT goals: Goals met/education completed, patient discharged from [facility] Discharge plan remains appropriate    Precautions / Restrictions Precautions Precautions: Shoulder Type of Shoulder Precautions: sling at all times, order is for ADL training and sling ed Shoulder Interventions: Shoulder sling/immobilizer;At all times Precaution Comments: reviewed with pt and husband Restrictions Weight Bearing Restrictions: Yes LUE Weight Bearing: Non weight bearing   Pertinent Vitals/Pain 7/10 L shoulder, premedicated, ice, repositioned, VSS    ADL  Toilet Transfer: Modified independent Toilet Transfer Method: Sit to Loss adjuster, chartered: Comfort height toilet Toileting - Clothing Manipulation and Hygiene: Modified independent Where Assessed - Toileting Clothing Manipulation and Hygiene: Sit to stand from 3-in-1 or toilet Transfers/Ambulation Related to ADLs: modified independent, moves slowly ADL Comments: Husband present for education.  Practiced donning and doffing sling, reviewed positioning, elbow to hand ROM, bathing and dressing.    OT Diagnosis:    OT Problem List:   OT Treatment Interventions:     OT Goals(current goals can now be found in the care plan  section) Acute Rehab OT Goals Patient Stated Goal: L shoulder pain relief  Visit Information  Last OT Received On: 10/02/13 History of Present Illness: s/p L TSA due to arthritis    Subjective Data      Prior Functioning       Cognition  Cognition Arousal/Alertness: Awake/alert Behavior During Therapy: WFL for tasks assessed/performed Overall Cognitive Status: Within Functional Limits for tasks assessed    Mobility       Exercises      Balance    End of Session OT - End of Session Activity Tolerance: Patient tolerated treatment well Patient left: in bed;with call bell/phone within reach;with family/visitor present  GO     Malka So 10/02/2013, 10:42 AM [phone]

## 2013-10-06 NOTE — Progress Notes (Signed)
Agree with above 

## 2013-10-10 NOTE — Discharge Summary (Addendum)
Physician Discharge Summary  Patient ID: Madeline Clarke MRN: [id number] DOB/AGE: [birth year] 63 y.o.  Admit date: 09/30/2013 Discharge date: 10/01/2013  Admission Diagnoses:  Osteoarthritis of left shoulder  Discharge Diagnoses:  Principal Problem:   Osteoarthritis of left shoulder Active Problems:   Primary localized osteoarthrosis, shoulder region   Past Medical History  Diagnosis Date  . Fibroid   . Hypertension   . GERD (gastroesophageal reflux disease)   . Depression   . Elevated cholesterol   . Osteoarthritis of left shoulder 09/30/2013    Surgeries: Procedure(s): LEFT TOTAL SHOULDER ARTHROPLASTY on 09/30/2013   Consultants (if any):    Discharged Condition: Improved  Hospital Course: Madeline Clarke is an 63 y.o. female who was admitted 09/30/2013 with a diagnosis of Osteoarthritis of left shoulder and went to the operating room on 09/30/2013 and underwent the above named procedures.    She was given perioperative antibiotics:  Anti-infectives   Start     Dose/Rate Route Frequency Ordered Stop   09/30/13 1800  clindamycin (CLEOCIN) IVPB 600 mg     600 mg 100 mL/hr over 30 Minutes Intravenous Every 6 hours 09/30/13 1632 10/01/13 0639   09/30/13 0600  clindamycin (CLEOCIN) IVPB 900 mg     900 mg 100 mL/hr over 30 Minutes Intravenous On call to O.R. 09/29/13 1412 09/30/13 1049    .  She was given sequential compression devices, early ambulation, and lovenox for DVT prophylaxis.  She benefited maximally from the hospital stay and there were no complications.    Recent vital signs:  Filed Vitals:   10/02/13 0439  BP: 122/71  Pulse: 87  Temp: 98.5 F (36.9 C)  Resp: 18    Recent laboratory studies:  Lab Results  Component Value Date   HGB 10.2* 10/01/2013   HGB 10.2* 09/30/2013   HGB 12.0 09/25/2013   Lab Results  Component Value Date   WBC 10.2 10/01/2013   PLT 246 10/01/2013   No results found for this basename: INR   Lab Results  Component Value Date   NA 137 10/01/2013   K 3.6* 10/01/2013   CL 97 10/01/2013   CO2 29 10/01/2013   BUN 12 10/01/2013   CREATININE 0.63 10/01/2013   GLUCOSE 108* 10/01/2013    Discharge Medications:     Medication List    STOP taking these medications       meloxicam 15 MG tablet  Commonly known as:  MOBIC      TAKE these medications       albuterol 108 (90 BASE) MCG/ACT inhaler  Commonly known as:  PROVENTIL HFA;VENTOLIN HFA  Inhale 2 puffs into the lungs as needed for shortness of breath (emergency use only).     aspirin EC 81 MG tablet  Take 81 mg by mouth daily.     atorvastatin 40 MG tablet  Commonly known as:  LIPITOR  Take 40 mg by mouth every evening.     Calcium-Vitamin D 600-400 MG-UNIT Tabs  Take 1 tablet by mouth 2 (two) times daily.     carvedilol 25 MG tablet  Commonly known as:  COREG  Take 25 mg by mouth 2 (two) times daily.     FISH OIL PO  Take 1 capsule by mouth 2 (two) times daily before lunch and supper.     glucosamine-chondroitin 500-400 MG tablet  Take 1 tablet by mouth daily.     methocarbamol 500 MG tablet  Commonly known as:  ROBAXIN  Take 1 tablet (  500 mg total) by mouth 4 (four) times daily.     omeprazole 40 MG capsule  Commonly known as:  PRILOSEC  Take 40 mg by mouth daily.     oxyCODONE-acetaminophen 10-325 MG per tablet  Commonly known as:  PERCOCET  Take 1-2 tablets by mouth every 6 (six) hours as needed for pain. MAXIMUM TOTAL ACETAMINOPHEN DOSE IS 4000 MG PER DAY     promethazine 25 MG tablet  Commonly known as:  PHENERGAN  Take 1 tablet (25 mg total) by mouth every 6 (six) hours as needed for nausea or vomiting.     sennosides-docusate sodium 8.6-50 MG tablet  Commonly known as:  SENOKOT-S  Take 2 tablets by mouth daily.     valsartan-hydrochlorothiazide 320-25 MG per tablet  Commonly known as:  DIOVAN-HCT  Take 1 tablet by mouth daily.     venlafaxine XR 37.5 MG 24 hr capsule  Commonly known as:  EFFEXOR-XR  Take 37.5 mg by mouth  daily.     Vitamin D3 2000 UNITS Tabs  Take 2,000 Units by mouth every morning.        Diagnostic Studies: Dg Chest 2 View  09/25/2013   CLINICAL DATA:  Preop for left shoulder arthroplasty  EXAM: CHEST  2 VIEW  COMPARISON:  CT scan 09/08/2013  FINDINGS: Heart size and vascular pattern are normal. Lungs are clear. Sub coracoid loose body measuring about 1 cm stable from CT scan.  IMPRESSION: No active cardiopulmonary disease.   Electronically Signed   By: Skipper Cliche M.D.   On: 09/25/2013 10:07   Dg Shoulder Left  09/30/2013   CLINICAL DATA:  Left shoulder replacement.  EXAM: LEFT SHOULDER - 2+ VIEW  COMPARISON:  None.  FINDINGS: Left shoulder replacement. No fracture or dislocation. Good anatomic alignment.  IMPRESSION: Left shoulder replacement.  No acute abnormality.   Electronically Signed   By: Marcello Moores  Register   On: 09/30/2013 15:10    Disposition: 01-Home or Self Care      Discharge Orders   Future Orders Complete By Expires   Call MD / Call 911  As directed    Comments:     If you experience chest pain or shortness of breath, CALL 911 and be transported to the hospital emergency room.  If you develope a fever above 101 F, pus (white drainage) or increased drainage or redness at the wound, or calf pain, call your surgeon's office.   Constipation Prevention  As directed    Comments:     Drink plenty of fluids.  Prune juice may be helpful.  You may use a stool softener, such as Colace (over the counter) 100 mg twice a day.  Use MiraLax (over the counter) for constipation as needed.   Diet general  As directed    Discharge instructions  As directed    Comments:     Change dressing in 3 days and reapply fresh dressing, unless you have a splint (half cast).  If you have a splint/cast, just leave in place until your follow-up appointment.    Keep wounds dry for 3 weeks.  Leave steri-strips in place on skin.  Do not apply lotion or anything to the wound.   OT splint  As directed  09/30/2014   Comments:     Ok for elbow, wrist, and hand motion.  Sling at all times except for hygiene, no shoulder activity until 6 weeks.      Follow-up Information   Follow up with [facility]  P, MD. Schedule an appointment as soon as possible for a visit in 2 weeks.   Specialty:  Orthopedic Surgery   Contact information:   [address] [place] [address] [phone]        Signed: Johnny Bridge 10/10/2013, 11:01 AM

## 2013-11-28 ENCOUNTER — Other Ambulatory Visit: Payer: Self-pay | Admitting: Family Medicine

## 2013-11-28 DIAGNOSIS — R221 Localized swelling, mass and lump, neck: Secondary | ICD-10-CM

## 2013-12-03 ENCOUNTER — Ambulatory Visit
Admission: RE | Admit: 2013-12-03 | Discharge: 2013-12-03 | Disposition: A | Discharge status: Home / Self Care | Payer: BC Managed Care – PPO | Source: Ambulatory Visit | Attending: Family Medicine | Admitting: Family Medicine

## 2013-12-03 DIAGNOSIS — R221 Localized swelling, mass and lump, neck: Secondary | ICD-10-CM

## 2014-06-16 ENCOUNTER — Other Ambulatory Visit: Payer: Self-pay

## 2014-06-16 DIAGNOSIS — Z1231 Encounter for screening mammogram for malignant neoplasm of breast: Secondary | ICD-10-CM

## 2014-07-03 ENCOUNTER — Other Ambulatory Visit: Payer: Self-pay

## 2014-07-11 IMAGING — CR DG CHEST 2V
2 series · 2 of 2 positions shown · non-contrast
Comparison: CT scan 09/08/2013

CLINICAL DATA: Preop for left shoulder arthroplasty

EXAM:
CHEST  2 VIEW

[w chest pa]
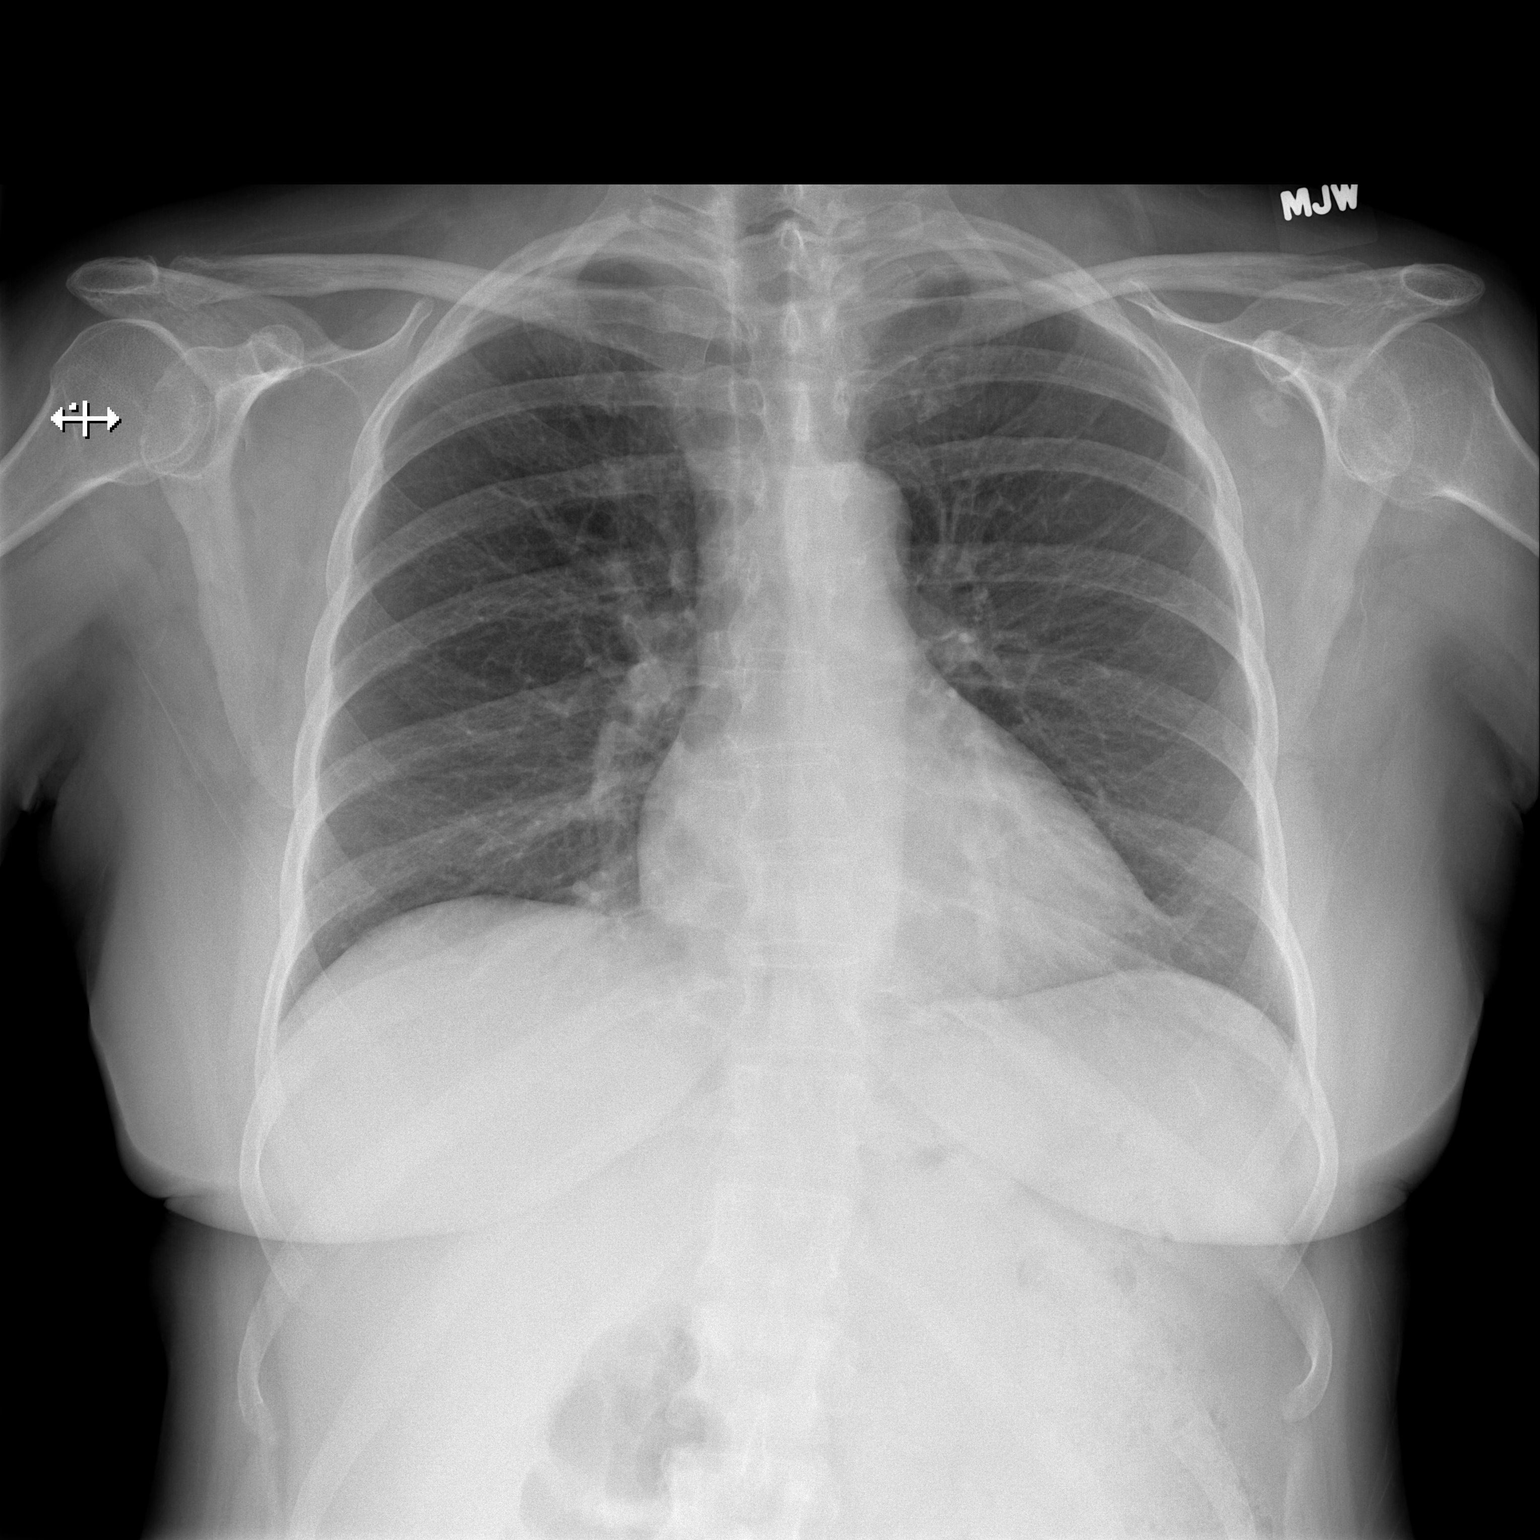

[w chest lat]
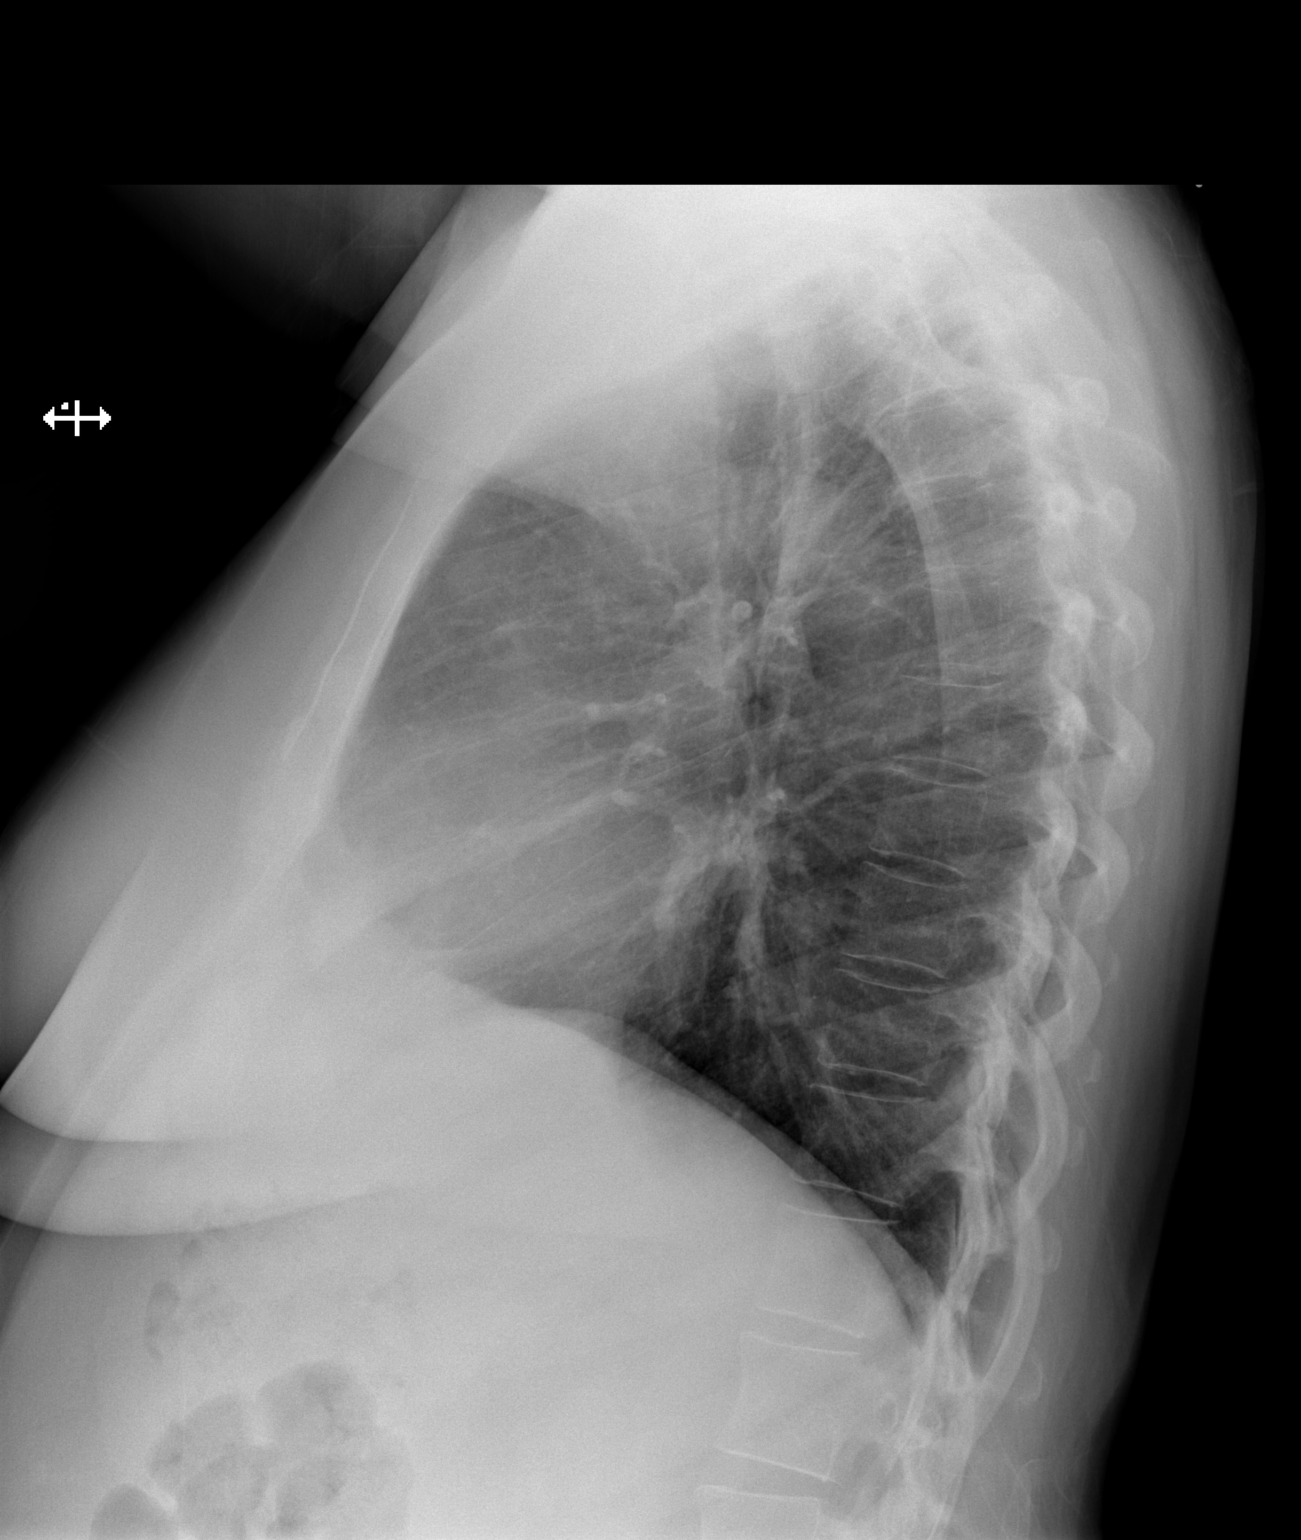

[2 of 2 positions shown; findings below may reference images not displayed]

FINDINGS: Heart size and vascular pattern are normal. Lungs are clear. Sub
coracoid loose body measuring about 1 cm stable from CT scan.
IMPRESSION: No active cardiopulmonary disease.

## 2014-07-16 IMAGING — DX DG SHOULDER 2+V*L*
2 series · 3 of 3 positions shown · non-contrast
Comparison: None.

CLINICAL DATA: Left shoulder replacement.

EXAM:
LEFT SHOULDER - 2+ VIEW

[Series 1: ap · left · 0.17mm/px · 2 of 2 slices shown (1 of 2)]
[im 1/2]
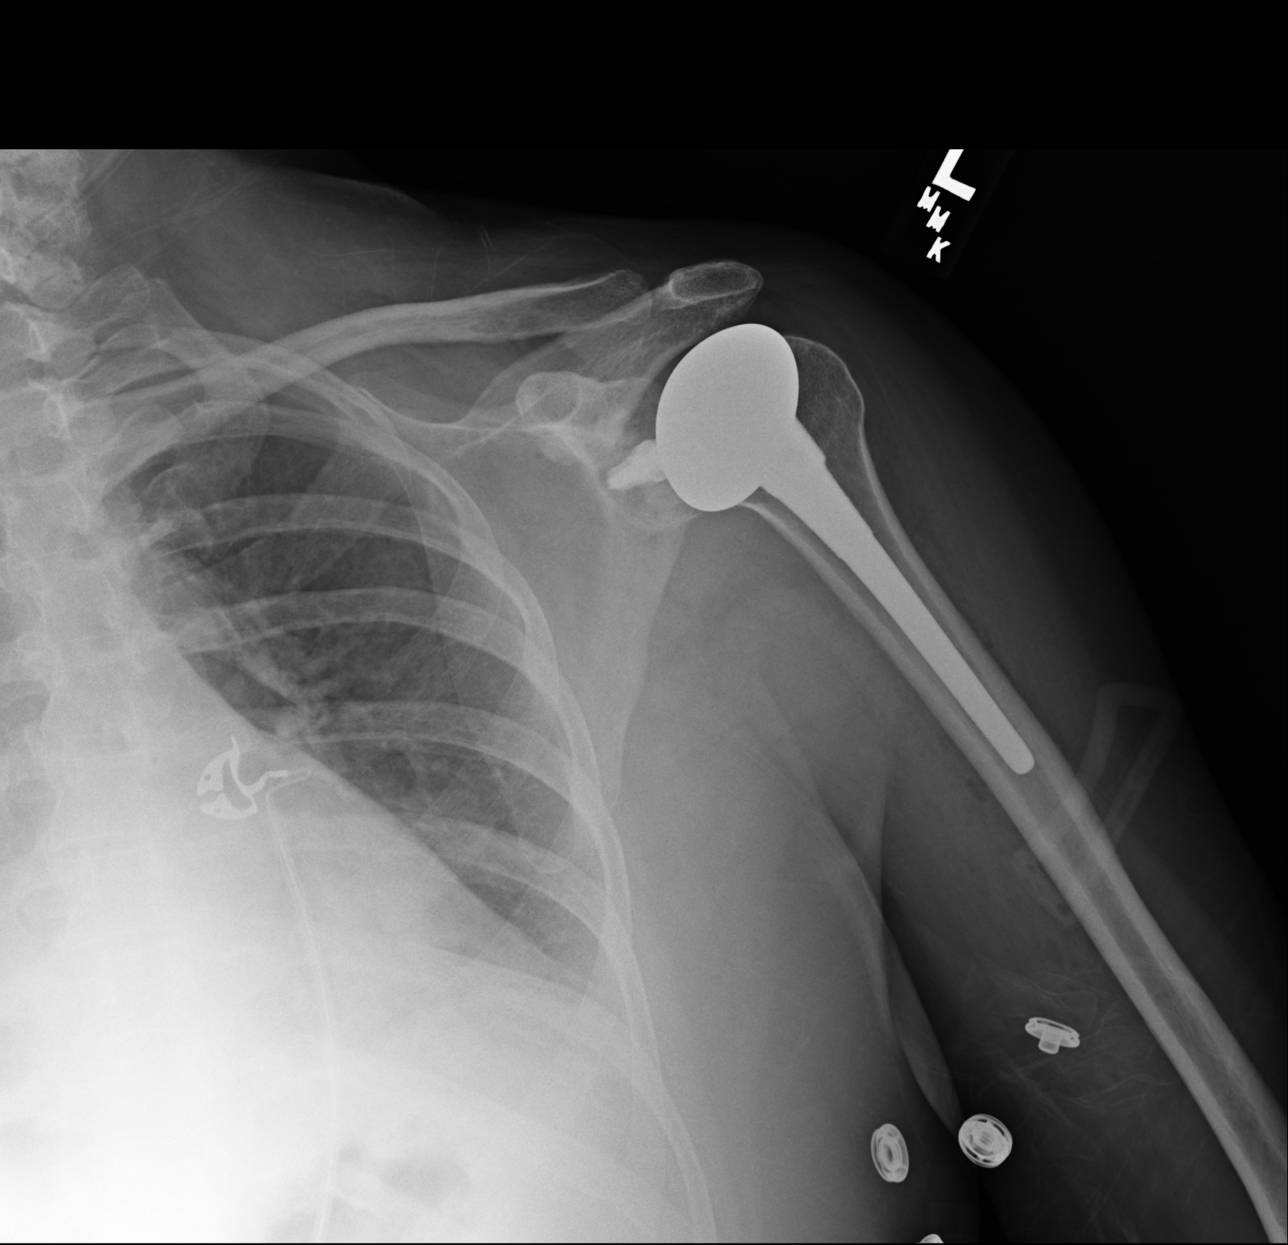
[im 2/2]
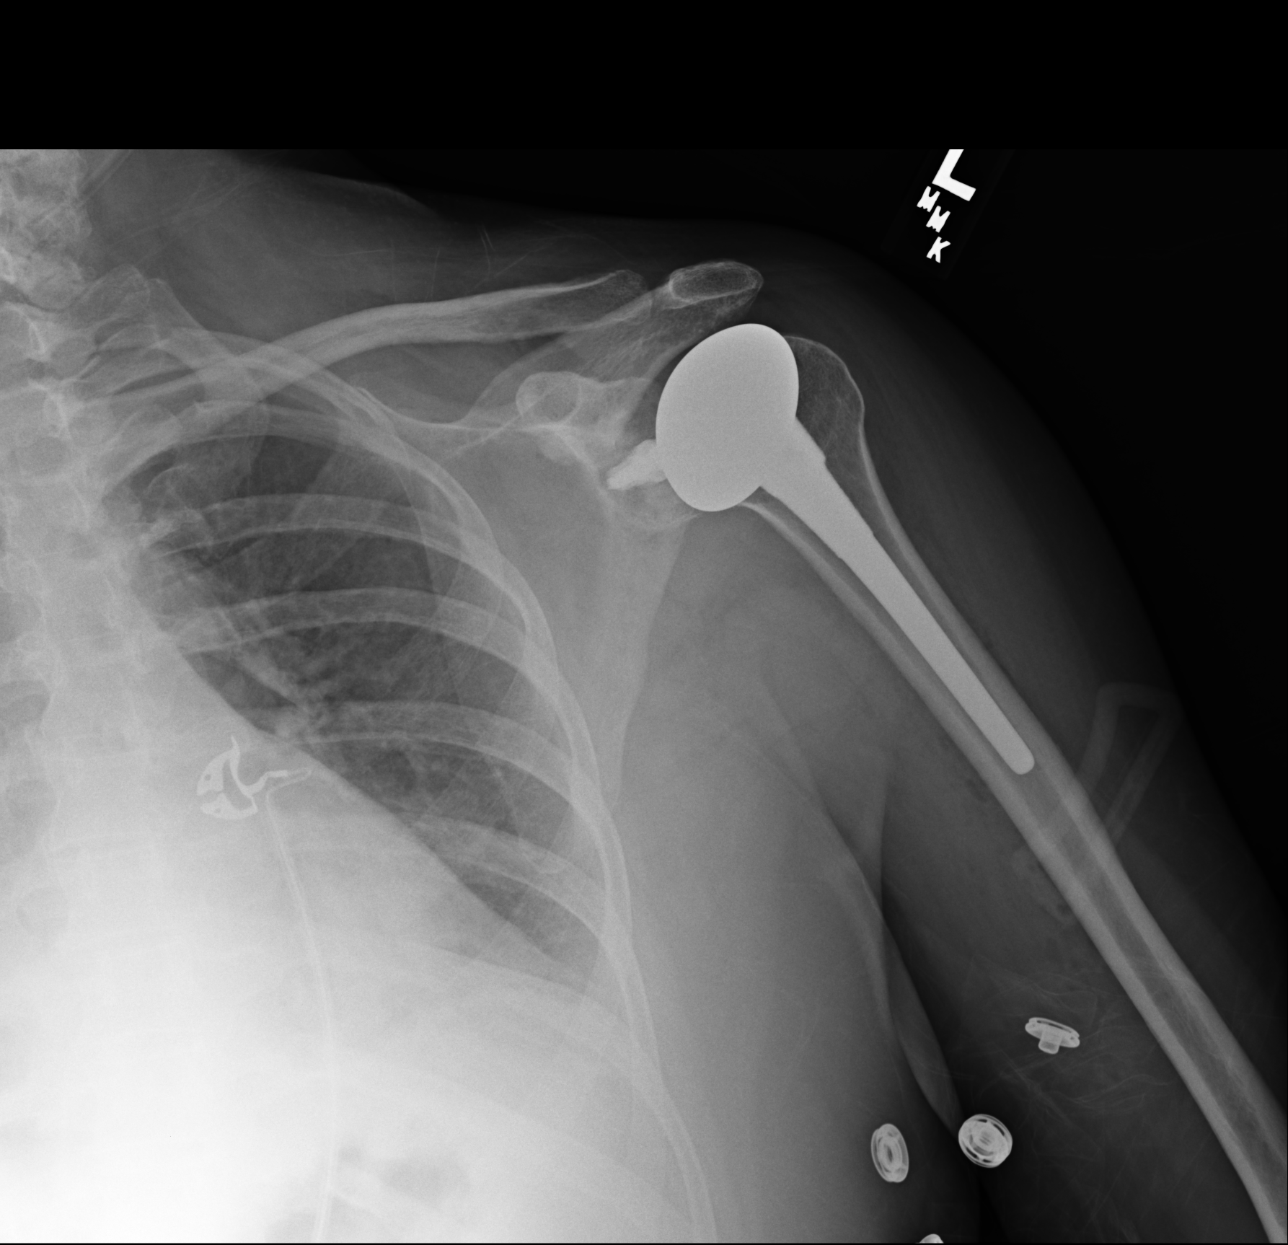

[ap (2 of 2)]
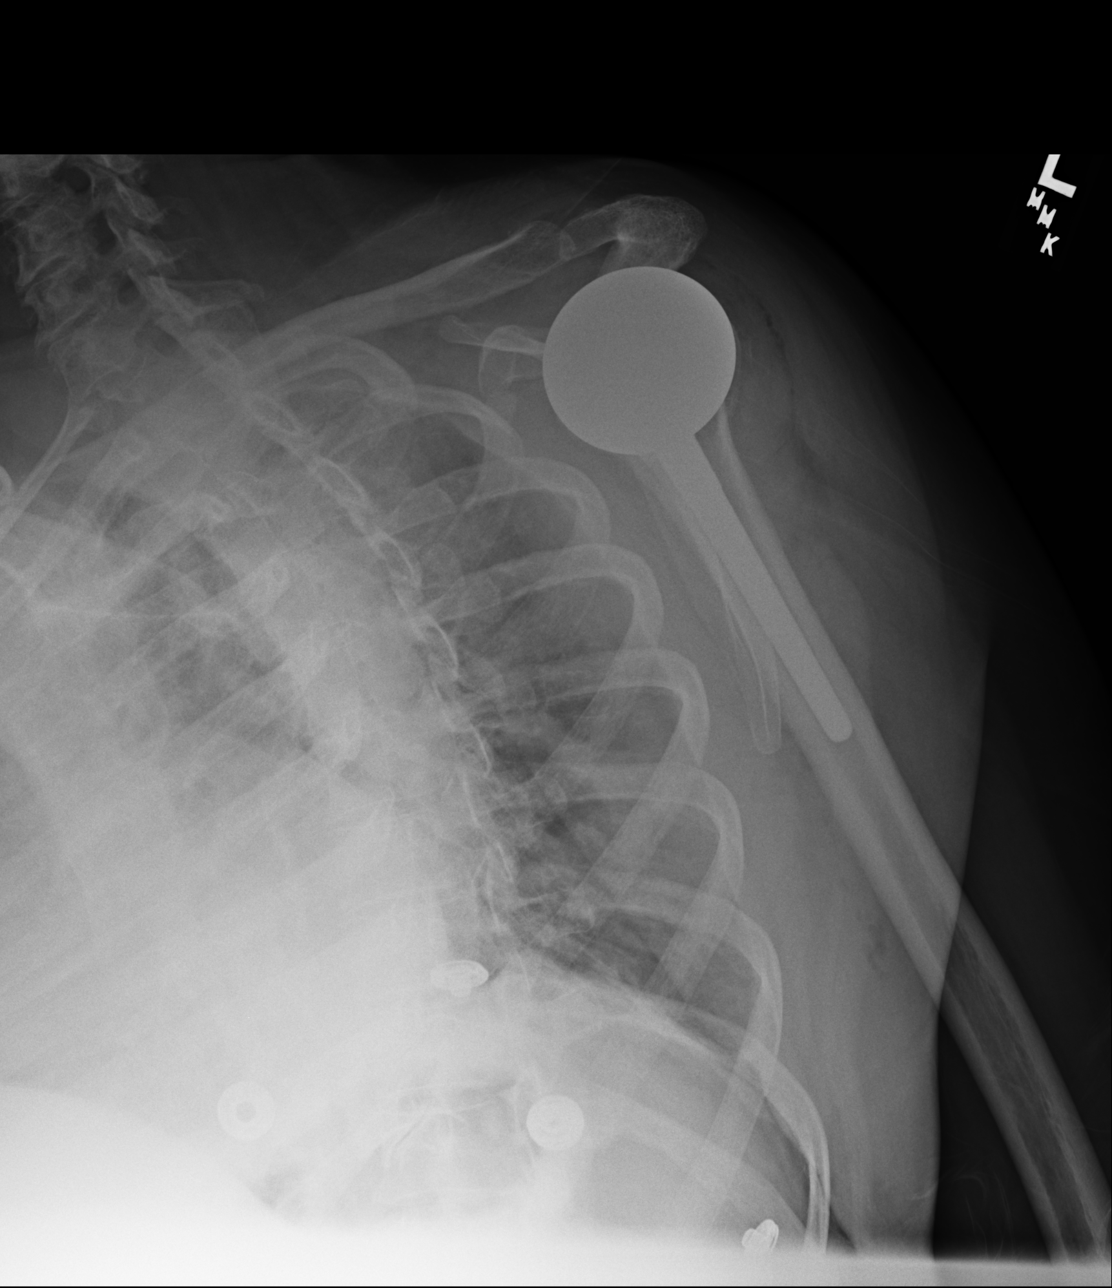

[3 of 3 positions shown; findings below may reference images not displayed]

FINDINGS: Left shoulder replacement. No fracture or dislocation. Good anatomic
alignment.
IMPRESSION: Left shoulder replacement.  No acute abnormality.

## 2014-07-17 ENCOUNTER — Ambulatory Visit
Admission: RE | Admit: 2014-07-17 | Discharge: 2014-07-17 | Disposition: A | Discharge status: Home / Self Care | Payer: BC Managed Care – PPO | Source: Ambulatory Visit

## 2014-07-17 DIAGNOSIS — Z1231 Encounter for screening mammogram for malignant neoplasm of breast: Secondary | ICD-10-CM

## 2014-10-02 ENCOUNTER — Other Ambulatory Visit: Payer: Self-pay | Admitting: Family Medicine

## 2014-10-02 DIAGNOSIS — M8580 Other specified disorders of bone density and structure, unspecified site: Secondary | ICD-10-CM

## 2014-10-07 ENCOUNTER — Ambulatory Visit
Admission: RE | Admit: 2014-10-07 | Discharge: 2014-10-07 | Disposition: A | Discharge status: Home / Self Care | Payer: BLUE CROSS/BLUE SHIELD | Source: Ambulatory Visit | Attending: Family Medicine | Admitting: Family Medicine

## 2014-10-07 DIAGNOSIS — M8580 Other specified disorders of bone density and structure, unspecified site: Secondary | ICD-10-CM

## 2014-12-04 ENCOUNTER — Other Ambulatory Visit: Payer: Self-pay | Admitting: Orthopedic Surgery

## 2014-12-04 ENCOUNTER — Encounter (HOSPITAL_BASED_OUTPATIENT_CLINIC_OR_DEPARTMENT_OTHER)
Admission: RE | Admit: 2014-12-04 | Discharge: 2014-12-04 | Disposition: A | Discharge status: Home / Self Care | Payer: BLUE CROSS/BLUE SHIELD | Source: Ambulatory Visit | Attending: Orthopedic Surgery | Admitting: Orthopedic Surgery

## 2014-12-04 ENCOUNTER — Encounter (HOSPITAL_BASED_OUTPATIENT_CLINIC_OR_DEPARTMENT_OTHER): Payer: Self-pay | Admitting: *Deleted

## 2014-12-04 ENCOUNTER — Other Ambulatory Visit: Payer: Self-pay

## 2014-12-04 DIAGNOSIS — Z9851 Tubal ligation status: Secondary | ICD-10-CM | POA: Diagnosis not present

## 2014-12-04 DIAGNOSIS — Z880 Allergy status to penicillin: Secondary | ICD-10-CM | POA: Diagnosis not present

## 2014-12-04 DIAGNOSIS — Z87891 Personal history of nicotine dependence: Secondary | ICD-10-CM | POA: Diagnosis not present

## 2014-12-04 DIAGNOSIS — Z91013 Allergy to seafood: Secondary | ICD-10-CM | POA: Diagnosis not present

## 2014-12-04 DIAGNOSIS — Z96612 Presence of left artificial shoulder joint: Secondary | ICD-10-CM | POA: Diagnosis not present

## 2014-12-04 DIAGNOSIS — E780 Pure hypercholesterolemia: Secondary | ICD-10-CM | POA: Diagnosis not present

## 2014-12-04 DIAGNOSIS — M1711 Unilateral primary osteoarthritis, right knee: Secondary | ICD-10-CM | POA: Diagnosis present

## 2014-12-04 DIAGNOSIS — Z01810 Encounter for preprocedural cardiovascular examination: Secondary | ICD-10-CM | POA: Insufficient documentation

## 2014-12-04 DIAGNOSIS — I10 Essential (primary) hypertension: Secondary | ICD-10-CM | POA: Diagnosis not present

## 2014-12-04 DIAGNOSIS — K219 Gastro-esophageal reflux disease without esophagitis: Secondary | ICD-10-CM | POA: Diagnosis not present

## 2014-12-04 DIAGNOSIS — Z7982 Long term (current) use of aspirin: Secondary | ICD-10-CM | POA: Diagnosis not present

## 2014-12-04 DIAGNOSIS — Z91030 Bee allergy status: Secondary | ICD-10-CM | POA: Diagnosis not present

## 2014-12-04 DIAGNOSIS — Z888 Allergy status to other drugs, medicaments and biological substances status: Secondary | ICD-10-CM | POA: Diagnosis not present

## 2014-12-04 DIAGNOSIS — Z90710 Acquired absence of both cervix and uterus: Secondary | ICD-10-CM | POA: Diagnosis not present

## 2014-12-04 DIAGNOSIS — F329 Major depressive disorder, single episode, unspecified: Secondary | ICD-10-CM | POA: Diagnosis not present

## 2014-12-04 LAB — BASIC METABOLIC PANEL
Anion gap: 10 (ref 5–15)
BUN: 15 mg/dL (ref 6–23)
CO2: 29 mmol/L (ref 19–32)
Calcium: 9.9 mg/dL (ref 8.4–10.5)
Chloride: 102 mmol/L (ref 96–112)
Creatinine, Ser: 0.75 mg/dL (ref 0.50–1.10)
GFR, EST NON AFRICAN AMERICAN: 88 mL/min — AB (ref >90)
GLUCOSE: 113 mg/dL — AB (ref 70–99)
Potassium: 4.8 mmol/L (ref 3.5–5.1)
Sodium: 141 mmol/L (ref 135–145)

## 2014-12-04 LAB — SURGICAL PCR SCREEN
MRSA, PCR: NEGATIVE
STAPHYLOCOCCUS AUREUS: NEGATIVE

## 2014-12-18 ENCOUNTER — Ambulatory Visit (HOSPITAL_BASED_OUTPATIENT_CLINIC_OR_DEPARTMENT_OTHER): Payer: BLUE CROSS/BLUE SHIELD | Admitting: Certified Registered"

## 2014-12-18 ENCOUNTER — Ambulatory Visit (HOSPITAL_BASED_OUTPATIENT_CLINIC_OR_DEPARTMENT_OTHER)
Admission: RE | Admit: 2014-12-18 | Discharge: 2014-12-19 | Disposition: A | Discharge status: Home / Self Care | Payer: BLUE CROSS/BLUE SHIELD | Source: Ambulatory Visit | Attending: Orthopedic Surgery | Admitting: Orthopedic Surgery

## 2014-12-18 ENCOUNTER — Encounter (HOSPITAL_BASED_OUTPATIENT_CLINIC_OR_DEPARTMENT_OTHER): Payer: Self-pay

## 2014-12-18 ENCOUNTER — Ambulatory Visit (HOSPITAL_COMMUNITY): Payer: BLUE CROSS/BLUE SHIELD

## 2014-12-18 ENCOUNTER — Encounter (HOSPITAL_BASED_OUTPATIENT_CLINIC_OR_DEPARTMENT_OTHER)
Admission: RE | Disposition: A | Discharge status: Home / Self Care | Payer: Self-pay | Source: Ambulatory Visit | Attending: Orthopedic Surgery

## 2014-12-18 DIAGNOSIS — Z880 Allergy status to penicillin: Secondary | ICD-10-CM | POA: Insufficient documentation

## 2014-12-18 DIAGNOSIS — Z90710 Acquired absence of both cervix and uterus: Secondary | ICD-10-CM | POA: Insufficient documentation

## 2014-12-18 DIAGNOSIS — I10 Essential (primary) hypertension: Secondary | ICD-10-CM | POA: Insufficient documentation

## 2014-12-18 DIAGNOSIS — Z91013 Allergy to seafood: Secondary | ICD-10-CM | POA: Insufficient documentation

## 2014-12-18 DIAGNOSIS — Z888 Allergy status to other drugs, medicaments and biological substances status: Secondary | ICD-10-CM | POA: Insufficient documentation

## 2014-12-18 DIAGNOSIS — K219 Gastro-esophageal reflux disease without esophagitis: Secondary | ICD-10-CM | POA: Insufficient documentation

## 2014-12-18 DIAGNOSIS — Z9851 Tubal ligation status: Secondary | ICD-10-CM | POA: Insufficient documentation

## 2014-12-18 DIAGNOSIS — M1711 Unilateral primary osteoarthritis, right knee: Secondary | ICD-10-CM | POA: Insufficient documentation

## 2014-12-18 DIAGNOSIS — E780 Pure hypercholesterolemia: Secondary | ICD-10-CM | POA: Insufficient documentation

## 2014-12-18 DIAGNOSIS — Z96651 Presence of right artificial knee joint: Secondary | ICD-10-CM

## 2014-12-18 DIAGNOSIS — Z87891 Personal history of nicotine dependence: Secondary | ICD-10-CM | POA: Insufficient documentation

## 2014-12-18 DIAGNOSIS — Z91030 Bee allergy status: Secondary | ICD-10-CM | POA: Insufficient documentation

## 2014-12-18 DIAGNOSIS — Z7982 Long term (current) use of aspirin: Secondary | ICD-10-CM | POA: Insufficient documentation

## 2014-12-18 DIAGNOSIS — Z96612 Presence of left artificial shoulder joint: Secondary | ICD-10-CM | POA: Insufficient documentation

## 2014-12-18 DIAGNOSIS — F329 Major depressive disorder, single episode, unspecified: Secondary | ICD-10-CM | POA: Insufficient documentation

## 2014-12-18 HISTORY — PX: PARTIAL KNEE ARTHROPLASTY: SHX2174

## 2014-12-18 HISTORY — DX: Unilateral primary osteoarthritis, right knee: M17.11

## 2014-12-18 LAB — POCT HEMOGLOBIN-HEMACUE: HEMOGLOBIN: 12.3 g/dL (ref 12.0–15.0)

## 2014-12-18 SURGERY — ARTHROPLASTY, KNEE, UNICOMPARTMENTAL
Anesthesia: Regional | Site: Knee | Laterality: Right

## 2014-12-18 MED ORDER — HYDROMORPHONE HCL 1 MG/ML IJ SOLN
INTRAMUSCULAR | Status: AC
  Filled 2014-12-18: qty 1

## 2014-12-18 MED ORDER — FENTANYL CITRATE 0.05 MG/ML IJ SOLN
INTRAMUSCULAR | Status: DC | PRN
  Administered 2014-12-18: 25 ug via INTRAVENOUS
  Administered 2014-12-18: 50 ug via INTRAVENOUS
  Administered 2014-12-18: 25 ug via INTRAVENOUS

## 2014-12-18 MED ORDER — METHOCARBAMOL 500 MG PO TABS
500.0000 mg | ORAL_TABLET | Freq: Four times a day (QID) | ORAL | Status: DC | PRN
  Administered 2014-12-18 – 2014-12-19 (×4): 500 mg via ORAL
  Filled 2014-12-18 (×4): qty 1

## 2014-12-18 MED ORDER — LIDOCAINE HCL (CARDIAC) 20 MG/ML IV SOLN
INTRAVENOUS | Status: DC | PRN
  Administered 2014-12-18: 75 mg via INTRAVENOUS

## 2014-12-18 MED ORDER — OXYCODONE-ACETAMINOPHEN 10-325 MG PO TABS: 1.0000 | ORAL_TABLET | Freq: Four times a day (QID) | ORAL | Status: AC | PRN

## 2014-12-18 MED ORDER — PANTOPRAZOLE SODIUM 40 MG PO TBEC: 80.0000 mg | DELAYED_RELEASE_TABLET | Freq: Every day | ORAL | Status: DC

## 2014-12-18 MED ORDER — METHOCARBAMOL 500 MG PO TABS: 500.0000 mg | ORAL_TABLET | Freq: Four times a day (QID) | ORAL | Status: AC

## 2014-12-18 MED ORDER — KETOROLAC TROMETHAMINE 30 MG/ML IJ SOLN
INTRAMUSCULAR | Status: AC
  Filled 2014-12-18: qty 1

## 2014-12-18 MED ORDER — SODIUM CHLORIDE 0.9 % IV SOLN
INTRAVENOUS | Status: DC
  Administered 2014-12-18: 11:00:00 via INTRAVENOUS

## 2014-12-18 MED ORDER — PROPOFOL 10 MG/ML IV BOLUS
INTRAVENOUS | Status: AC
  Filled 2014-12-18: qty 40

## 2014-12-18 MED ORDER — VENLAFAXINE HCL ER 37.5 MG PO CP24: 37.5000 mg | ORAL_CAPSULE | Freq: Every day | ORAL | Status: DC

## 2014-12-18 MED ORDER — OXYCODONE HCL 5 MG PO TABS: 5.0000 mg | ORAL_TABLET | Freq: Once | ORAL | Status: AC | PRN

## 2014-12-18 MED ORDER — CARVEDILOL 25 MG PO TABS
25.0000 mg | ORAL_TABLET | Freq: Two times a day (BID) | ORAL | Status: DC
  Administered 2014-12-18: 25 mg via ORAL

## 2014-12-18 MED ORDER — BISACODYL 10 MG RE SUPP: 10.0000 mg | Freq: Every day | RECTAL | Status: DC | PRN

## 2014-12-18 MED ORDER — SENNA 8.6 MG PO TABS: 1.0000 | ORAL_TABLET | Freq: Two times a day (BID) | ORAL | Status: DC

## 2014-12-18 MED ORDER — MIDAZOLAM HCL 2 MG/2ML IJ SOLN
1.0000 mg | INTRAMUSCULAR | Status: DC | PRN
  Administered 2014-12-18: 1 mg via INTRAVENOUS

## 2014-12-18 MED ORDER — DEXAMETHASONE SODIUM PHOSPHATE 10 MG/ML IJ SOLN
INTRAMUSCULAR | Status: DC | PRN
  Administered 2014-12-18: 10 mg via INTRAVENOUS

## 2014-12-18 MED ORDER — MIDAZOLAM HCL 2 MG/2ML IJ SOLN
INTRAMUSCULAR | Status: AC
  Filled 2014-12-18: qty 2

## 2014-12-18 MED ORDER — ATORVASTATIN CALCIUM 40 MG PO TABS
40.0000 mg | ORAL_TABLET | Freq: Every evening | ORAL | Status: DC
  Administered 2014-12-18: 40 mg via ORAL

## 2014-12-18 MED ORDER — POLYETHYLENE GLYCOL 3350 17 G PO PACK: 17.0000 g | PACK | Freq: Every day | ORAL | Status: DC | PRN

## 2014-12-18 MED ORDER — ONDANSETRON HCL 4 MG PO TABS: 4.0000 mg | ORAL_TABLET | Freq: Four times a day (QID) | ORAL | Status: DC | PRN

## 2014-12-18 MED ORDER — CEFAZOLIN SODIUM-DEXTROSE 2-3 GM-% IV SOLR
2.0000 g | INTRAVENOUS | Status: AC
  Administered 2014-12-18: 2 g via INTRAVENOUS

## 2014-12-18 MED ORDER — ONDANSETRON HCL 4 MG/2ML IJ SOLN: 4.0000 mg | Freq: Four times a day (QID) | INTRAMUSCULAR | Status: DC | PRN

## 2014-12-18 MED ORDER — HYDROMORPHONE HCL 1 MG/ML IJ SOLN: 1.0000 mg | INTRAMUSCULAR | Status: DC | PRN

## 2014-12-18 MED ORDER — ALBUTEROL SULFATE HFA 108 (90 BASE) MCG/ACT IN AERS: 2.0000 | INHALATION_SPRAY | RESPIRATORY_TRACT | Status: DC | PRN

## 2014-12-18 MED ORDER — OXYCODONE HCL 5 MG PO TABS
5.0000 mg | ORAL_TABLET | ORAL | Status: DC | PRN
  Administered 2014-12-18: 5 mg via ORAL
  Administered 2014-12-18 – 2014-12-19 (×3): 10 mg via ORAL
  Filled 2014-12-18 (×4): qty 2

## 2014-12-18 MED ORDER — MAGNESIUM CITRATE PO SOLN: 1.0000 | Freq: Once | ORAL | Status: AC | PRN

## 2014-12-18 MED ORDER — OXYCODONE-ACETAMINOPHEN 5-325 MG PO TABS
1.0000 | ORAL_TABLET | ORAL | Status: DC | PRN
  Administered 2014-12-18: 1 via ORAL
  Administered 2014-12-18 – 2014-12-19 (×5): 2 via ORAL
  Filled 2014-12-18 (×6): qty 2

## 2014-12-18 MED ORDER — OXYCODONE HCL 5 MG/5ML PO SOLN: 5.0000 mg | Freq: Once | ORAL | Status: AC | PRN

## 2014-12-18 MED ORDER — CEFAZOLIN SODIUM 1-5 GM-% IV SOLN
1.0000 g | Freq: Four times a day (QID) | INTRAVENOUS | Status: AC
  Administered 2014-12-18 – 2014-12-19 (×3): 1 g via INTRAVENOUS
  Filled 2014-12-18: qty 50

## 2014-12-18 MED ORDER — LACTATED RINGERS IV SOLN
INTRAVENOUS | Status: DC
  Administered 2014-12-18: 07:00:00 via INTRAVENOUS
  Administered 2014-12-18: 10 mL/h via INTRAVENOUS
  Administered 2014-12-18: 08:00:00 via INTRAVENOUS

## 2014-12-18 MED ORDER — PROPOFOL 10 MG/ML IV BOLUS
INTRAVENOUS | Status: DC | PRN
  Administered 2014-12-18: 150 mg via INTRAVENOUS
  Administered 2014-12-18: 50 mg via INTRAVENOUS

## 2014-12-18 MED ORDER — DOCUSATE SODIUM 100 MG PO CAPS
100.0000 mg | ORAL_CAPSULE | Freq: Two times a day (BID) | ORAL | Status: DC
Start: 2014-12-18 — End: 2014-12-19

## 2014-12-18 MED ORDER — METHOCARBAMOL 1000 MG/10ML IJ SOLN: 500.0000 mg | Freq: Four times a day (QID) | INTRAVENOUS | Status: DC | PRN

## 2014-12-18 MED ORDER — PROMETHAZINE HCL 25 MG PO TABS: 25.0000 mg | ORAL_TABLET | Freq: Four times a day (QID) | ORAL | Status: AC | PRN

## 2014-12-18 MED ORDER — BUPIVACAINE-EPINEPHRINE (PF) 0.5% -1:200000 IJ SOLN
INTRAMUSCULAR | Status: DC | PRN
  Administered 2014-12-18: 30 mL via PERINEURAL

## 2014-12-18 MED ORDER — HYDROMORPHONE HCL 1 MG/ML IJ SOLN
0.2500 mg | INTRAMUSCULAR | Status: DC | PRN
  Administered 2014-12-18 (×5): 0.5 mg via INTRAVENOUS

## 2014-12-18 MED ORDER — ONDANSETRON HCL 4 MG/2ML IJ SOLN
INTRAMUSCULAR | Status: DC | PRN
  Administered 2014-12-18: 4 mg via INTRAVENOUS

## 2014-12-18 MED ORDER — FENTANYL CITRATE 0.05 MG/ML IJ SOLN
INTRAMUSCULAR | Status: AC
  Filled 2014-12-18: qty 6

## 2014-12-18 MED ORDER — ZOLPIDEM TARTRATE 5 MG PO TABS: 5.0000 mg | ORAL_TABLET | Freq: Every evening | ORAL | Status: DC | PRN

## 2014-12-18 MED ORDER — RIVAROXABAN 10 MG PO TABS: 10.0000 mg | ORAL_TABLET | Freq: Every day | ORAL | Status: AC

## 2014-12-18 MED ORDER — FENTANYL CITRATE 0.05 MG/ML IJ SOLN
INTRAMUSCULAR | Status: AC
  Filled 2014-12-18: qty 2

## 2014-12-18 MED ORDER — VALSARTAN-HYDROCHLOROTHIAZIDE 320-25 MG PO TABS: 1.0000 | ORAL_TABLET | Freq: Every day | ORAL | Status: DC

## 2014-12-18 MED ORDER — CEFAZOLIN SODIUM 1-5 GM-% IV SOLN
INTRAVENOUS | Status: AC
  Filled 2014-12-18: qty 50

## 2014-12-18 MED ORDER — FENTANYL CITRATE 0.05 MG/ML IJ SOLN
50.0000 ug | INTRAMUSCULAR | Status: DC | PRN
  Administered 2014-12-18: 50 ug via INTRAVENOUS

## 2014-12-18 MED ORDER — KETOROLAC TROMETHAMINE 30 MG/ML IJ SOLN
30.0000 mg | Freq: Once | INTRAMUSCULAR | Status: AC
  Administered 2014-12-18: 30 mg via INTRAVENOUS

## 2014-12-18 MED ORDER — HYDROMORPHONE HCL 1 MG/ML IJ SOLN: 0.5000 mg | INTRAMUSCULAR | Status: DC | PRN

## 2014-12-18 SURGICAL SUPPLY — 66 items
BANDAGE ELASTIC 6 VELCRO ST LF (GAUZE/BANDAGES/DRESSINGS) ×3 IMPLANT
BANDAGE ESMARK 6X9 LF (GAUZE/BANDAGES/DRESSINGS) ×1 IMPLANT
BLADE SURG 10 STRL SS (BLADE) ×3 IMPLANT
BLADE SURG 15 STRL LF DISP TIS (BLADE) ×2 IMPLANT
BLADE SURG 15 STRL SS (BLADE) ×6
BNDG CMPR 9X6 STRL LF SNTH (GAUZE/BANDAGES/DRESSINGS) ×1
BNDG ESMARK 6X9 LF (GAUZE/BANDAGES/DRESSINGS) ×3
BOWL SMART MIX CTS (DISPOSABLE) ×3 IMPLANT
CANISTER SUCT 1200ML W/VALVE (MISCELLANEOUS) IMPLANT
CANISTER SUCTION 2500CC (MISCELLANEOUS) ×3 IMPLANT
CAPT KNEE PARTIAL 2 ×2 IMPLANT
CEMENT HV SMART SET (Cement) ×3 IMPLANT
CLOSURE STERI-STRIP 1/2X4 (GAUZE/BANDAGES/DRESSINGS) ×1
CLSR STERI-STRIP ANTIMIC 1/2X4 (GAUZE/BANDAGES/DRESSINGS) ×2 IMPLANT
COVER BACK TABLE 60X90IN (DRAPES) ×3 IMPLANT
CUFF TOURNIQUET SINGLE 34IN LL (TOURNIQUET CUFF) ×2 IMPLANT
DECANTER SPIKE VIAL GLASS SM (MISCELLANEOUS) IMPLANT
DRAPE EXTREMITY T 121X128X90 (DRAPE) ×3 IMPLANT
DRAPE U 20/CS (DRAPES) ×3 IMPLANT
DRAPE U-SHAPE 47X51 STRL (DRAPES) ×3 IMPLANT
DRSG PAD ABDOMINAL 8X10 ST (GAUZE/BANDAGES/DRESSINGS) ×3 IMPLANT
DURAPREP 26ML APPLICATOR (WOUND CARE) ×3 IMPLANT
ELECT REM PT RETURN 9FT ADLT (ELECTROSURGICAL) ×3
ELECTRODE REM PT RTRN 9FT ADLT (ELECTROSURGICAL) ×1 IMPLANT
FACESHIELD WRAPAROUND (MASK) ×6 IMPLANT
FACESHIELD WRAPAROUND OR TEAM (MASK) ×2 IMPLANT
GAUZE SPONGE 4X4 12PLY STRL (GAUZE/BANDAGES/DRESSINGS) ×3 IMPLANT
GLOVE BIO SURGEON STRL SZ 6.5 (GLOVE) ×1 IMPLANT
GLOVE BIO SURGEON STRL SZ8 (GLOVE) ×3 IMPLANT
GLOVE BIO SURGEONS STRL SZ 6.5 (GLOVE) ×1
GLOVE BIOGEL PI IND STRL 7.0 (GLOVE) IMPLANT
GLOVE BIOGEL PI IND STRL 8 (GLOVE) ×2 IMPLANT
GLOVE BIOGEL PI INDICATOR 7.0 (GLOVE) ×4
GLOVE BIOGEL PI INDICATOR 8 (GLOVE) ×4
GLOVE ORTHO TXT STRL SZ7.5 (GLOVE) ×3 IMPLANT
GOWN STRL REUS W/ TWL LRG LVL3 (GOWN DISPOSABLE) ×1 IMPLANT
GOWN STRL REUS W/ TWL XL LVL3 (GOWN DISPOSABLE) ×2 IMPLANT
GOWN STRL REUS W/TWL LRG LVL3 (GOWN DISPOSABLE) ×3
GOWN STRL REUS W/TWL XL LVL3 (GOWN DISPOSABLE) ×6
HANDPIECE INTERPULSE COAX TIP (DISPOSABLE) ×3
IMMOBILIZER KNEE 22 UNIV (SOFTGOODS) ×3 IMPLANT
IMMOBILIZER KNEE 24 THIGH 36 (MISCELLANEOUS) ×1 IMPLANT
IMMOBILIZER KNEE 24 UNIV (MISCELLANEOUS)
NS IRRIG 1000ML POUR BTL (IV SOLUTION) ×3 IMPLANT
PACK ARTHROSCOPY DSU (CUSTOM PROCEDURE TRAY) ×3 IMPLANT
PACK BASIN DAY SURGERY FS (CUSTOM PROCEDURE TRAY) ×3 IMPLANT
PENCIL BUTTON HOLSTER BLD 10FT (ELECTRODE) ×3 IMPLANT
SAWBLADE OXFORD PARTIAL (BLADE) ×3 IMPLANT
SET HNDPC FAN SPRY TIP SCT (DISPOSABLE) ×1 IMPLANT
SHEET MEDIUM DRAPE 40X70 STRL (DRAPES) ×3 IMPLANT
SLEEVE SCD COMPRESS KNEE MED (MISCELLANEOUS) ×3 IMPLANT
SPONGE LAP 18X18 X RAY DECT (DISPOSABLE) ×3 IMPLANT
SUCTION FRAZIER TIP 10 FR DISP (SUCTIONS) ×3 IMPLANT
SUT MNCRL AB 4-0 PS2 18 (SUTURE) IMPLANT
SUT VIC AB 0 CT1 27 (SUTURE) ×3
SUT VIC AB 0 CT1 27XBRD ANBCTR (SUTURE) ×1 IMPLANT
SUT VIC AB 2-0 SH 27 (SUTURE) ×3
SUT VIC AB 2-0 SH 27XBRD (SUTURE) ×1 IMPLANT
SUT VICRYL 3-0 CR8 SH (SUTURE) ×3 IMPLANT
SUT VICRYL 4-0 PS2 18IN ABS (SUTURE) IMPLANT
SYR BULB IRRIGATION 50ML (SYRINGE) ×3 IMPLANT
TOWEL OR 17X24 6PK STRL BLUE (TOWEL DISPOSABLE) ×3 IMPLANT
TOWEL OR NON WOVEN STRL DISP B (DISPOSABLE) ×6 IMPLANT
TUBE CONNECTING 20'X1/4 (TUBING)
TUBE CONNECTING 20X1/4 (TUBING) IMPLANT
YANKAUER SUCT BULB TIP NO VENT (SUCTIONS) ×2 IMPLANT

## 2014-12-18 NOTE — Anesthesia Procedure Notes (Addendum)
Anesthesia Regional Block:  Femoral nerve block  Pre-Anesthetic Checklist: ,, timeout performed, Correct Patient, Correct Site, Correct Laterality, Correct Procedure,, site marked, risks and benefits discussed, Surgical consent,  Pre-op evaluation,  At surgeon's request and post-op pain management  Laterality: Right  Prep: chloraprep       Needles:  Injection technique: Single-shot  Needle Type: Echogenic Stimulator Needle     Needle Length: 9cm 9 cm Needle Gauge: 21 and 21 G    Additional Needles:  Procedures: nerve stimulator Femoral nerve block  Nerve Stimulator or Paresthesia:  Response: Quadriceps muscle contraction, 0.45 mA,   Additional Responses:   Narrative:  Start time: 12/18/2014 7:04 AM End time: 12/18/2014 7:13 AM Injection made incrementally with aspirations every 5 mL.  Performed by: Personally  Anesthesiologist: HODIERNE, ADAM  Additional Notes: Functioning IV was confirmed and monitors were applied.  A 90mm 21ga Arrow echogenic stimulator needle was used. Sterile prep and drape,hand hygiene and sterile gloves were used.  Negative aspiration and negative test dose prior to incremental administration of local anesthetic. The patient tolerated the procedure well.     Procedure Name: LMA Insertion Date/Time: 12/18/2014 7:39 AM Performed by: Baxter Flattery Pre-anesthesia Checklist: Patient identified, Emergency Drugs available, Suction available and Patient being monitored Patient Re-evaluated:Patient Re-evaluated prior to inductionOxygen Delivery Method: Circle System Utilized Preoxygenation: Pre-oxygenation with 100% oxygen Intubation Type: IV induction Ventilation: Mask ventilation without difficulty LMA: LMA inserted LMA Size: 3.0 Number of attempts: 1 Airway Equipment and Method: Bite block Placement Confirmation: positive ETCO2 and breath sounds checked- equal and bilateral Tube secured with: Tape Dental Injury: Teeth and Oropharynx as per  pre-operative assessment

## 2014-12-18 NOTE — Op Note (Signed)
12/18/2014  8:58 AM  PATIENT:  Madeline Clarke    PRE-OPERATIVE DIAGNOSIS:  UNILATERAL PRIMARY OSTEOARTHRITIS RIGHT KNEE   POST-OPERATIVE DIAGNOSIS:  Same  PROCEDURE:  RIGHT UNICOMPARTMENTAL KNEE ARTHROPLASTY   SURGEON:  Johnny Bridge, MD  PHYSICIAN ASSISTANT: Joya Gaskins, OPA-C, present and scrubbed throughout the case, critical for completion in a timely fashion, and for retraction, instrumentation, and closure.  ANESTHESIA:   General  PREOPERATIVE INDICATIONS:  Madeline Clarke is a  64 y.o. female with a diagnosis of UNILATERAL PRIMARY OSTEOARTHRITIS RIGHT KNEE  who failed conservative measures and elected for surgical management.    The risks benefits and alternatives were discussed with the patient preoperatively including but not limited to the risks of infection, bleeding, nerve injury, cardiopulmonary complications, blood clots, the need for revision surgery, among others, and the patient was willing to proceed.  OPERATIVE IMPLANTS: Biomet [employer] mobile bearing medial compartment arthroplasty femur size XS, tibia size B, bearing size 4.  OPERATIVE FINDINGS: Endstage grade 4 medial compartment osteoarthritis. No significant changes in the lateral or patellofemoral joint.  The ACL was intact. There was some medial patellar facet wear, but the lateral side was intact.  OPERATIVE PROCEDURE: The patient was brought to the operating room placed in supine position. General anesthesia was administered. IV antibiotics were given. The lower extremity was placed in the legholder and prepped and draped in usual sterile fashion.  Time out was performed.  The leg was elevated and exsanguinated and the tourniquet was inflated. Anteromedial incision was performed, and I took care to preserve the MCL. Parapatellar incision was carried out, and the osteophytes were excised, along with the medial meniscus and a small portion of the fat pad.  The extra medullary tibial cutting jig was applied, using  the spoon and the 11mm G-Clamp and the 2 mm shim, and I took care to protect the anterior cruciate ligament insertion and the tibial spine. The medial collateral ligament was also protected, and I resected my proximal tibia, matching the anatomic slope.   The proximal tibial bony cut was removed in one piece, and I turned my attention to the femur.  The intramedullary femoral rod was placed using the drill, and then using the appropriate reference, I assembled the femoral jig, setting my posterior cutting block. I resected my posterior femur, used the 0 spigot for the anterior femur, and then measured my gap.   I then used the appropriate mill to match the extension gap to the flexion gap. The second milling was at a 4, and then again at a 5.  The gaps were then measured again with the appropriate feeler gauges. Once I had balanced flexion and extension gaps, I then completed the preparation of the femur.  I milled off the anterior aspect of the distal femur to prevent impingement. I also exposed the tibia, and selected the above-named component, and then used the cutting jig to prepare the keel slot on the tibia. I also used the awl to curette out the bone to complete the preparation of the keel. The back wall was intact.  I then placed trial components, and it was found to have excellent motion, and appropriate balance.  I then cemented the components into place, cementing the tibia first, removing all excess cement, and then cementing the femur.  All loose cement was removed.  The real polyethylene insert was applied manually, and the knee was taken through functional range of motion, and found to have excellent stability and restoration of  joint motion, with excellent balance.  The wounds were irrigated copiously, and the parapatellar tissue closed with Vicryl, followed by Vicryl for the subcutaneous tissue, with routine closure with Steri-Strips and sterile gauze.  The tourniquet was released,  and the patient was awakened and extubated and returned to PACU in stable and satisfactory condition. There were no complications.

## 2014-12-18 NOTE — Discharge Instructions (Signed)

## 2014-12-18 NOTE — Progress Notes (Signed)
Assisted Dr. Marcie Bal with right, ultrasound guided, femoral nerve block. Side rails up, monitors on throughout procedure. See vital signs in flow sheet. Tolerated Procedure well.

## 2014-12-18 NOTE — Anesthesia Preprocedure Evaluation (Signed)
Anesthesia Evaluation  Patient identified by MRN, date of birth, ID band Patient awake    Reviewed: Allergy & Precautions, NPO status , Patient's Chart, lab work & pertinent test results  Airway Mallampati: II   Neck ROM: full    Dental   Pulmonary former smoker,  breath sounds clear to auscultation        Cardiovascular hypertension, Rhythm:regular Rate:Normal     Neuro/Psych Depression    GI/Hepatic GERD-  ,  Endo/Other  obese  Renal/GU      Musculoskeletal  (+) Arthritis -, Osteoarthritis,    Abdominal   Peds  Hematology   Anesthesia Other Findings   Reproductive/Obstetrics                             Anesthesia Physical Anesthesia Plan  ASA: II  Anesthesia Plan: General and Regional   Post-op Pain Management: MAC Combined w/ Regional for Post-op pain   Induction: Intravenous  Airway Management Planned: LMA  Additional Equipment:   Intra-op Plan:   Post-operative Plan:   Informed Consent: I have reviewed the patients History and Physical, chart, labs and discussed the procedure including the risks, benefits and alternatives for the proposed anesthesia with the patient or authorized representative who has indicated his/her understanding and acceptance.     Plan Discussed with: CRNA, Anesthesiologist and Surgeon  Anesthesia Plan Comments:         Anesthesia Quick Evaluation

## 2014-12-18 NOTE — Transfer of Care (Signed)
Immediate Anesthesia Transfer of Care Note  Patient: Madeline Clarke  Procedure(s) Performed: Procedure(s): RIGHT UNI KNEE ARTHROPLASTY  (Right)  Patient Location: PACU  Anesthesia Type:GA combined with regional for post-op pain  Level of Consciousness: awake, alert  and oriented  Airway & Oxygen Therapy: Patient Spontanous Breathing and Patient connected to face mask oxygen  Post-op Assessment: Report given to RN, Post -op Vital signs reviewed and stable and Patient moving all extremities  Post vital signs: Reviewed and stable  Last Vitals:  Filed Vitals:   12/18/14 0725  BP:   Pulse: 71  Temp:   Resp: 16    Complications: No apparent anesthesia complications
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
## 2014-12-18 NOTE — Anesthesia Postprocedure Evaluation (Signed)
Anesthesia Post Note  Patient: Madeline Clarke  Procedure(s) Performed: Procedure(s) (LRB): RIGHT UNI KNEE ARTHROPLASTY  (Right)  Anesthesia type: General  Patient location: PACU  Post pain: Pain level controlled and Adequate analgesia  Post assessment: Post-op Vital signs reviewed, Patient's Cardiovascular Status Stable, Respiratory Function Stable, Patent Airway and Pain level controlled  Last Vitals:  Filed Vitals:   12/18/14 1030  BP:   Pulse: 86  Temp:   Resp: 18    Post vital signs: Reviewed and stable  Level of consciousness: awake, alert  and oriented  Complications: No apparent anesthesia complications

## 2014-12-19 DIAGNOSIS — M1711 Unilateral primary osteoarthritis, right knee: Secondary | ICD-10-CM | POA: Diagnosis not present

## 2014-12-19 MED ORDER — CEFAZOLIN SODIUM 1-5 GM-% IV SOLN
INTRAVENOUS | Status: AC
  Filled 2014-12-19: qty 50

## 2014-12-22 ENCOUNTER — Encounter (HOSPITAL_BASED_OUTPATIENT_CLINIC_OR_DEPARTMENT_OTHER): Payer: Self-pay | Admitting: Orthopedic Surgery

## 2015-06-18 ENCOUNTER — Other Ambulatory Visit: Payer: Self-pay

## 2015-06-18 DIAGNOSIS — Z1231 Encounter for screening mammogram for malignant neoplasm of breast: Secondary | ICD-10-CM

## 2015-07-19 ENCOUNTER — Ambulatory Visit
Admission: RE | Admit: 2015-07-19 | Discharge: 2015-07-19 | Disposition: A | Discharge status: Home / Self Care | Payer: BLUE CROSS/BLUE SHIELD | Source: Ambulatory Visit

## 2015-07-19 DIAGNOSIS — Z1231 Encounter for screening mammogram for malignant neoplasm of breast: Secondary | ICD-10-CM

## 2016-01-21 ENCOUNTER — Other Ambulatory Visit: Payer: Self-pay | Admitting: Gastroenterology

## 2016-06-16 ENCOUNTER — Other Ambulatory Visit: Payer: Self-pay | Admitting: Family Medicine

## 2016-06-16 DIAGNOSIS — Z1231 Encounter for screening mammogram for malignant neoplasm of breast: Secondary | ICD-10-CM

## 2016-07-21 ENCOUNTER — Ambulatory Visit
Admission: RE | Admit: 2016-07-21 | Discharge: 2016-07-21 | Disposition: A | Discharge status: Home / Self Care | Payer: BLUE CROSS/BLUE SHIELD | Source: Ambulatory Visit | Attending: Family Medicine | Admitting: Family Medicine

## 2016-07-21 DIAGNOSIS — Z1231 Encounter for screening mammogram for malignant neoplasm of breast: Secondary | ICD-10-CM

## 2016-12-27 ENCOUNTER — Other Ambulatory Visit: Payer: Self-pay | Admitting: Family Medicine

## 2016-12-27 DIAGNOSIS — M8589 Other specified disorders of bone density and structure, multiple sites: Secondary | ICD-10-CM

## 2017-01-03 ENCOUNTER — Ambulatory Visit
Admission: RE | Admit: 2017-01-03 | Discharge: 2017-01-03 | Disposition: A | Discharge status: Home / Self Care | Payer: BLUE CROSS/BLUE SHIELD | Source: Ambulatory Visit | Attending: Family Medicine | Admitting: Family Medicine

## 2017-01-03 DIAGNOSIS — M8589 Other specified disorders of bone density and structure, multiple sites: Secondary | ICD-10-CM

## 2017-08-22 ENCOUNTER — Other Ambulatory Visit: Payer: Self-pay | Admitting: Family Medicine

## 2017-08-22 DIAGNOSIS — Z1231 Encounter for screening mammogram for malignant neoplasm of breast: Secondary | ICD-10-CM

## 2017-08-29 ENCOUNTER — Ambulatory Visit
Admission: RE | Admit: 2017-08-29 | Discharge: 2017-08-29 | Disposition: A | Discharge status: Home / Self Care | Payer: Medicare Other | Source: Ambulatory Visit | Attending: Family Medicine | Admitting: Family Medicine

## 2017-08-29 DIAGNOSIS — Z1231 Encounter for screening mammogram for malignant neoplasm of breast: Secondary | ICD-10-CM

## 2018-07-16 ENCOUNTER — Other Ambulatory Visit: Payer: Self-pay | Admitting: Family Medicine

## 2018-07-16 DIAGNOSIS — Z1231 Encounter for screening mammogram for malignant neoplasm of breast: Secondary | ICD-10-CM

## 2018-09-02 ENCOUNTER — Ambulatory Visit
Admission: RE | Admit: 2018-09-02 | Discharge: 2018-09-02 | Disposition: A | Discharge status: Home / Self Care | Payer: Medicare Other | Source: Ambulatory Visit | Attending: Family Medicine | Admitting: Family Medicine

## 2018-09-02 DIAGNOSIS — Z1231 Encounter for screening mammogram for malignant neoplasm of breast: Secondary | ICD-10-CM

## 2019-10-19 ENCOUNTER — Ambulatory Visit: Payer: Medicare Other

## 2019-10-25 ENCOUNTER — Ambulatory Visit: Payer: Medicare Other | Attending: Internal Medicine

## 2019-10-25 DIAGNOSIS — Z23 Encounter for immunization: Secondary | ICD-10-CM | POA: Insufficient documentation

## 2019-10-25 NOTE — Progress Notes (Signed)
   Covid-19 Vaccination Clinic  Name:  Madeline Clarke    MRN: [id number] DOB: 10-19-1950  10/25/2019  [name] was observed post Covid-19 immunization for 15 minutes without incidence. She was provided with Vaccine Information Sheet and instruction to access the V-Safe system.   Ms. Dec was instructed to call 911 with any severe reactions post vaccine: Marland Kitchen Difficulty breathing  . Swelling of your face and throat  . A fast heartbeat  . A bad rash all over your body  . Dizziness and weakness    Immunizations Administered    Name Date Dose VIS Date Route   Pfizer COVID-19 Vaccine 10/25/2019 10:46 AM 0.3 mL 08/29/2019 Intramuscular   Manufacturer: [place]   Lot: [id number]   [place]: [id number]

## 2019-10-30 ENCOUNTER — Ambulatory Visit: Payer: Medicare Other

## 2019-11-14 ENCOUNTER — Other Ambulatory Visit: Payer: Self-pay | Admitting: Family Medicine

## 2019-11-14 DIAGNOSIS — Z1231 Encounter for screening mammogram for malignant neoplasm of breast: Secondary | ICD-10-CM

## 2019-11-19 ENCOUNTER — Ambulatory Visit: Payer: Medicare Other | Attending: Internal Medicine

## 2019-11-19 DIAGNOSIS — Z23 Encounter for immunization: Secondary | ICD-10-CM | POA: Insufficient documentation

## 2019-11-19 NOTE — Progress Notes (Signed)
   Covid-19 Vaccination Clinic  Name:  LOREE LOMIBAO    MRN: [id number] DOB: 1951/08/02  11/19/2019  Ms. Whillock was observed post Covid-19 immunization for 15 minutes without incident. She was provided with Vaccine Information Sheet and instruction to access the V-Safe system.   Ms. Thielemann was instructed to call 911 with any severe reactions post vaccine: Marland Kitchen Difficulty breathing  . Swelling of face and throat  . A fast heartbeat  . A bad rash all over body  . Dizziness and weakness   Immunizations Administered    Name Date Dose VIS Date Route   Pfizer COVID-19 Vaccine 11/19/2019  8:36 AM 0.3 mL 08/29/2019 Intramuscular   Manufacturer: [place]   Lot: [id number]   [place]: [id number]

## 2020-01-01 ENCOUNTER — Ambulatory Visit: Payer: Medicare Other

## 2020-01-08 ENCOUNTER — Other Ambulatory Visit: Payer: Self-pay

## 2020-01-08 ENCOUNTER — Ambulatory Visit
Admission: RE | Admit: 2020-01-08 | Discharge: 2020-01-08 | Disposition: A | Discharge status: Home / Self Care | Payer: Medicare Other | Source: Ambulatory Visit | Attending: Family Medicine | Admitting: Family Medicine

## 2020-01-08 DIAGNOSIS — Z1231 Encounter for screening mammogram for malignant neoplasm of breast: Secondary | ICD-10-CM

## 2020-11-15 ENCOUNTER — Other Ambulatory Visit: Payer: Self-pay | Admitting: Family Medicine

## 2020-11-15 DIAGNOSIS — M8580 Other specified disorders of bone density and structure, unspecified site: Secondary | ICD-10-CM

## 2020-11-23 ENCOUNTER — Other Ambulatory Visit: Payer: Self-pay | Admitting: Family Medicine

## 2020-11-23 DIAGNOSIS — Z1231 Encounter for screening mammogram for malignant neoplasm of breast: Secondary | ICD-10-CM

## 2021-05-06 ENCOUNTER — Other Ambulatory Visit: Payer: Self-pay

## 2021-05-06 ENCOUNTER — Ambulatory Visit
Admission: RE | Admit: 2021-05-06 | Discharge: 2021-05-06 | Disposition: A | Discharge status: Home / Self Care | Payer: Medicare Other | Source: Ambulatory Visit | Attending: Family Medicine | Admitting: Family Medicine

## 2021-05-06 DIAGNOSIS — Z1231 Encounter for screening mammogram for malignant neoplasm of breast: Secondary | ICD-10-CM

## 2021-05-06 DIAGNOSIS — M8580 Other specified disorders of bone density and structure, unspecified site: Secondary | ICD-10-CM

## 2022-07-25 ENCOUNTER — Other Ambulatory Visit: Payer: Self-pay | Admitting: Family Medicine

## 2022-07-25 DIAGNOSIS — Z1231 Encounter for screening mammogram for malignant neoplasm of breast: Secondary | ICD-10-CM

## 2022-09-25 ENCOUNTER — Ambulatory Visit: Payer: Medicare Other

## 2022-11-23 ENCOUNTER — Ambulatory Visit
Admission: RE | Admit: 2022-11-23 | Discharge: 2022-11-23 | Disposition: A | Discharge status: Home / Self Care | Payer: Medicare Other | Source: Ambulatory Visit | Attending: Family Medicine | Admitting: Family Medicine

## 2022-11-23 DIAGNOSIS — Z1231 Encounter for screening mammogram for malignant neoplasm of breast: Secondary | ICD-10-CM

## 2022-12-05 ENCOUNTER — Other Ambulatory Visit: Payer: Self-pay | Admitting: Family Medicine

## 2022-12-05 DIAGNOSIS — M8580 Other specified disorders of bone density and structure, unspecified site: Secondary | ICD-10-CM

## 2023-06-04 ENCOUNTER — Ambulatory Visit
Admission: RE | Admit: 2023-06-04 | Discharge: 2023-06-04 | Disposition: A | Discharge status: Home / Self Care | Payer: Medicare Other | Source: Ambulatory Visit | Attending: Family Medicine | Admitting: Family Medicine

## 2023-06-04 DIAGNOSIS — M8580 Other specified disorders of bone density and structure, unspecified site: Secondary | ICD-10-CM

## 2023-07-14 ENCOUNTER — Other Ambulatory Visit: Payer: Self-pay | Admitting: Medical Genetics

## 2023-07-14 DIAGNOSIS — Z006 Encounter for examination for normal comparison and control in clinical research program: Secondary | ICD-10-CM

## 2023-12-28 ENCOUNTER — Telehealth: Payer: Self-pay

## 2023-12-28 DIAGNOSIS — I10 Essential (primary) hypertension: Secondary | ICD-10-CM

## 2023-12-28 NOTE — Patient Outreach (Signed)
 Submitted faxed referral for assistance.  [facility]  Population Health Care Management Assistant  Direct Dial: [phone]  Fax: [phone] Website: [email or web address]

## 2024-01-03 ENCOUNTER — Other Ambulatory Visit: Payer: Self-pay | Admitting: Family Medicine

## 2024-01-03 DIAGNOSIS — Z1231 Encounter for screening mammogram for malignant neoplasm of breast: Secondary | ICD-10-CM

## 2024-01-14 ENCOUNTER — Ambulatory Visit
Admission: RE | Admit: 2024-01-14 | Discharge: 2024-01-14 | Disposition: A | Discharge status: Home / Self Care | Source: Ambulatory Visit | Attending: Family Medicine | Admitting: Family Medicine

## 2024-01-14 DIAGNOSIS — Z1231 Encounter for screening mammogram for malignant neoplasm of breast: Secondary | ICD-10-CM

## 2024-04-04 ENCOUNTER — Encounter: Payer: Self-pay | Admitting: Advanced Practice Midwife

## 2024-07-28 ENCOUNTER — Other Ambulatory Visit: Payer: Self-pay | Admitting: Medical Genetics

## 2024-07-28 DIAGNOSIS — Z006 Encounter for examination for normal comparison and control in clinical research program: Secondary | ICD-10-CM

## 2024-09-22 ENCOUNTER — Other Ambulatory Visit (HOSPITAL_COMMUNITY): Payer: Self-pay

## 2024-09-22 MED ORDER — DOXAZOSIN MESYLATE 2 MG PO TABS
2.0000 mg | ORAL_TABLET | Freq: Every day | ORAL | 11 refills | Status: AC
  Filled 2024-09-22: qty 30, 30d supply, fill #0

## 2024-09-23 ENCOUNTER — Other Ambulatory Visit (HOSPITAL_COMMUNITY): Payer: Self-pay

## 2024-09-24 ENCOUNTER — Other Ambulatory Visit (HOSPITAL_COMMUNITY): Payer: Self-pay

## 2024-09-24 ENCOUNTER — Other Ambulatory Visit (HOSPITAL_BASED_OUTPATIENT_CLINIC_OR_DEPARTMENT_OTHER): Payer: Self-pay

## 2024-09-30 ENCOUNTER — Other Ambulatory Visit (HOSPITAL_COMMUNITY): Payer: Self-pay

## 2024-10-01 ENCOUNTER — Other Ambulatory Visit (HOSPITAL_COMMUNITY): Payer: Self-pay

## 2024-10-02 ENCOUNTER — Other Ambulatory Visit (HOSPITAL_COMMUNITY): Payer: Self-pay

## 2024-10-02 ENCOUNTER — Encounter (HOSPITAL_COMMUNITY): Payer: Self-pay

## 2024-10-02 MED ORDER — HYDROXYCHLOROQUINE SULFATE 200 MG PO TABS
400.0000 mg | ORAL_TABLET | Freq: Every day | ORAL | 0 refills | Status: DC
  Filled 2024-10-02: qty 168, 98d supply, fill #0
  Filled 2024-10-03: qty 168, 84d supply, fill #0

## 2024-10-03 ENCOUNTER — Other Ambulatory Visit (HOSPITAL_COMMUNITY): Payer: Self-pay

## 2024-10-05 ENCOUNTER — Other Ambulatory Visit (HOSPITAL_COMMUNITY): Payer: Self-pay

## 2024-10-05 MED ORDER — VALSARTAN 320 MG PO TABS
320.0000 mg | ORAL_TABLET | Freq: Every day | ORAL | 0 refills | Status: AC
  Filled 2024-10-05: qty 10, 10d supply, fill #0

## 2024-10-05 MED ORDER — HYDROXYCHLOROQUINE SULFATE 200 MG PO TABS
400.0000 mg | ORAL_TABLET | Freq: Every day | ORAL | 3 refills | Status: AC
  Filled 2024-10-05: qty 144, 84d supply, fill #0

## 2024-10-05 MED ORDER — ROSUVASTATIN CALCIUM 5 MG PO TABS
5.0000 mg | ORAL_TABLET | Freq: Every day | ORAL | 1 refills | Status: AC
  Filled 2024-10-05: qty 90, 90d supply, fill #0

## 2024-10-05 MED ORDER — ALLOPURINOL 100 MG PO TABS
100.0000 mg | ORAL_TABLET | Freq: Every day | ORAL | 3 refills | Status: AC
  Filled 2024-10-05: qty 90, 90d supply, fill #0

## 2024-10-05 MED ORDER — OMEPRAZOLE 40 MG PO CPDR
40.0000 mg | DELAYED_RELEASE_CAPSULE | Freq: Every day | ORAL | 1 refills | Status: AC
  Filled 2024-10-05: qty 90, 90d supply, fill #0

## 2024-10-05 MED ORDER — CARVEDILOL 25 MG PO TABS
25.0000 mg | ORAL_TABLET | Freq: Two times a day (BID) | ORAL | 1 refills | Status: AC
  Filled 2024-10-05: qty 180, 90d supply, fill #0

## 2024-10-05 MED ORDER — VENLAFAXINE HCL ER 37.5 MG PO CP24
37.5000 mg | ORAL_CAPSULE | Freq: Every day | ORAL | 2 refills | Status: AC
  Filled 2024-10-05: qty 90, 90d supply, fill #0

## 2024-10-06 ENCOUNTER — Other Ambulatory Visit (HOSPITAL_COMMUNITY): Payer: Self-pay

## 2024-10-06 ENCOUNTER — Other Ambulatory Visit: Payer: Self-pay

## 2024-10-07 ENCOUNTER — Other Ambulatory Visit (HOSPITAL_COMMUNITY): Payer: Self-pay

## 2024-10-08 ENCOUNTER — Other Ambulatory Visit: Payer: Self-pay

## 2024-10-08 ENCOUNTER — Other Ambulatory Visit (HOSPITAL_COMMUNITY): Payer: Self-pay

## 2024-10-08 MED ORDER — DOXAZOSIN MESYLATE 8 MG PO TABS
8.0000 mg | ORAL_TABLET | Freq: Every day | ORAL | 11 refills | Status: AC
  Filled 2024-10-08: qty 30, 30d supply, fill #0

## 2024-10-22 ENCOUNTER — Other Ambulatory Visit (HOSPITAL_COMMUNITY): Payer: Self-pay

## 2024-10-22 MED ORDER — DOXAZOSIN MESYLATE 8 MG PO TABS
8.0000 mg | ORAL_TABLET | Freq: Two times a day (BID) | ORAL | 0 refills | Status: AC
  Filled 2024-10-22: qty 60, 30d supply, fill #0

## 2024-10-23 ENCOUNTER — Other Ambulatory Visit (HOSPITAL_COMMUNITY): Payer: Self-pay
